# Patient Record
Sex: Female | Born: 1980 | Race: Black or African American | Hispanic: No | Marital: Single | Smoking: Current some day smoker
Health system: Southern US, Community
[De-identification: ages and names within clinical notes are randomized; demographics above are authoritative.]

## PROBLEM LIST (undated history)

## (undated) DIAGNOSIS — W3400XA Accidental discharge from unspecified firearms or gun, initial encounter: Secondary | ICD-10-CM

## (undated) DIAGNOSIS — E119 Type 2 diabetes mellitus without complications: Secondary | ICD-10-CM

## (undated) HISTORY — PX: APPENDECTOMY: SHX54

---

## 2015-03-17 DIAGNOSIS — W3400XA Accidental discharge from unspecified firearms or gun, initial encounter: Secondary | ICD-10-CM | POA: Insufficient documentation

## 2017-04-09 DIAGNOSIS — K831 Obstruction of bile duct: Secondary | ICD-10-CM | POA: Insufficient documentation

## 2017-04-10 DIAGNOSIS — Z8632 Personal history of gestational diabetes: Secondary | ICD-10-CM | POA: Insufficient documentation

## 2020-01-08 ENCOUNTER — Emergency Department (HOSPITAL_BASED_OUTPATIENT_CLINIC_OR_DEPARTMENT_OTHER)
Admission: EM | Admit: 2020-01-08 | Discharge: 2020-01-08 | Disposition: A | Discharge status: Home / Self Care | Payer: Medicaid Other | Attending: Emergency Medicine | Admitting: Emergency Medicine

## 2020-01-08 ENCOUNTER — Emergency Department (HOSPITAL_BASED_OUTPATIENT_CLINIC_OR_DEPARTMENT_OTHER): Payer: Medicaid Other

## 2020-01-08 ENCOUNTER — Other Ambulatory Visit: Payer: Self-pay

## 2020-01-08 ENCOUNTER — Encounter (HOSPITAL_BASED_OUTPATIENT_CLINIC_OR_DEPARTMENT_OTHER): Payer: Self-pay

## 2020-01-08 DIAGNOSIS — F17210 Nicotine dependence, cigarettes, uncomplicated: Secondary | ICD-10-CM | POA: Diagnosis not present

## 2020-01-08 DIAGNOSIS — R109 Unspecified abdominal pain: Secondary | ICD-10-CM | POA: Diagnosis present

## 2020-01-08 DIAGNOSIS — R7401 Elevation of levels of liver transaminase levels: Secondary | ICD-10-CM | POA: Insufficient documentation

## 2020-01-08 DIAGNOSIS — R350 Frequency of micturition: Secondary | ICD-10-CM | POA: Insufficient documentation

## 2020-01-08 DIAGNOSIS — R7989 Other specified abnormal findings of blood chemistry: Secondary | ICD-10-CM

## 2020-01-08 DIAGNOSIS — R631 Polydipsia: Secondary | ICD-10-CM | POA: Diagnosis not present

## 2020-01-08 DIAGNOSIS — R739 Hyperglycemia, unspecified: Secondary | ICD-10-CM | POA: Diagnosis not present

## 2020-01-08 DIAGNOSIS — R1013 Epigastric pain: Secondary | ICD-10-CM | POA: Diagnosis not present

## 2020-01-08 LAB — COMPREHENSIVE METABOLIC PANEL
ALT: 45 U/L — ABNORMAL HIGH (ref 0–44)
AST: 105 U/L — ABNORMAL HIGH (ref 15–41)
Albumin: 3.9 g/dL (ref 3.5–5.0)
Alkaline Phosphatase: 107 U/L (ref 38–126)
Anion gap: 10 (ref 5–15)
BUN: 10 mg/dL (ref 6–20)
CO2: 24 mmol/L (ref 22–32)
Calcium: 9.2 mg/dL (ref 8.9–10.3)
Chloride: 98 mmol/L (ref 98–111)
Creatinine, Ser: 0.75 mg/dL (ref 0.44–1.00)
GFR calc Af Amer: >60 mL/min (ref >60)
GFR calc non Af Amer: >60 mL/min (ref >60)
Glucose, Bld: 292 mg/dL — ABNORMAL HIGH (ref 70–99)
Potassium: 4.3 mmol/L (ref 3.5–5.1)
Sodium: 132 mmol/L — ABNORMAL LOW (ref 135–145)
Total Bilirubin: 1.6 mg/dL — ABNORMAL HIGH (ref 0.3–1.2)
Total Protein: 7.4 g/dL (ref 6.5–8.1)

## 2020-01-08 LAB — CBC WITH DIFFERENTIAL/PLATELET
Abs Immature Granulocytes: 0.01 10*3/uL (ref 0.00–0.07)
Basophils Absolute: 0 10*3/uL (ref 0.0–0.1)
Basophils Relative: 1 %
Eosinophils Absolute: 0.1 10*3/uL (ref 0.0–0.5)
Eosinophils Relative: 1 %
HCT: 46.5 % — ABNORMAL HIGH (ref 36.0–46.0)
Hemoglobin: 15.3 g/dL — ABNORMAL HIGH (ref 12.0–15.0)
Immature Granulocytes: 0 %
Lymphocytes Relative: 32 %
Lymphs Abs: 2.6 10*3/uL (ref 0.7–4.0)
MCH: 31.4 pg (ref 26.0–34.0)
MCHC: 32.9 g/dL (ref 30.0–36.0)
MCV: 95.5 fL (ref 80.0–100.0)
Monocytes Absolute: 0.6 10*3/uL (ref 0.1–1.0)
Monocytes Relative: 8 %
Neutro Abs: 4.8 10*3/uL (ref 1.7–7.7)
Neutrophils Relative %: 58 %
Platelets: 215 10*3/uL (ref 150–400)
RBC: 4.87 MIL/uL (ref 3.87–5.11)
RDW: 12 % (ref 11.5–15.5)
WBC: 8.2 10*3/uL (ref 4.0–10.5)
nRBC: 0 % (ref 0.0–0.2)

## 2020-01-08 LAB — URINALYSIS, ROUTINE W REFLEX MICROSCOPIC
Bilirubin Urine: NEGATIVE
Glucose, UA: >=500 mg/dL — AB
Hgb urine dipstick: NEGATIVE
Ketones, ur: NEGATIVE mg/dL
Leukocytes,Ua: NEGATIVE
Nitrite: NEGATIVE
Protein, ur: NEGATIVE mg/dL
Specific Gravity, Urine: 1.02 (ref 1.005–1.030)
pH: 7 (ref 5.0–8.0)

## 2020-01-08 LAB — URINALYSIS, MICROSCOPIC (REFLEX)

## 2020-01-08 LAB — CBG MONITORING, ED
Glucose-Capillary: 278 mg/dL — ABNORMAL HIGH (ref 70–99)
Glucose-Capillary: 224 mg/dL — ABNORMAL HIGH (ref 70–99)

## 2020-01-08 LAB — LIPASE, BLOOD: Lipase: 31 U/L (ref 11–51)

## 2020-01-08 LAB — PREGNANCY, URINE: Preg Test, Ur: NEGATIVE

## 2020-01-08 MED ORDER — ACETAMINOPHEN 325 MG PO TABS
650.0000 mg | ORAL_TABLET | Freq: Once | ORAL | Status: AC
  Administered 2020-01-08: 650 mg via ORAL
  Filled 2020-01-08: qty 2

## 2020-01-08 MED ORDER — PANTOPRAZOLE SODIUM 40 MG IV SOLR
40.0000 mg | Freq: Once | INTRAVENOUS | Status: AC
  Administered 2020-01-08: 40 mg via INTRAVENOUS
  Filled 2020-01-08: qty 40

## 2020-01-08 MED ORDER — METFORMIN HCL 500 MG PO TABS
500.0000 mg | ORAL_TABLET | Freq: Once | ORAL | Status: AC
  Administered 2020-01-08: 16:00:00 500 mg via ORAL
  Filled 2020-01-08: qty 1

## 2020-01-08 MED ORDER — SODIUM CHLORIDE 0.9 % IV BOLUS
1000.0000 mL | Freq: Once | INTRAVENOUS | Status: AC
  Administered 2020-01-08: 16:00:00 1000 mL via INTRAVENOUS

## 2020-01-08 MED ORDER — LIDOCAINE VISCOUS HCL 2 % MT SOLN
15.0000 mL | Freq: Once | OROMUCOSAL | Status: AC
  Administered 2020-01-08: 16:00:00 15 mL via ORAL
  Filled 2020-01-08: qty 15

## 2020-01-08 MED ORDER — PANTOPRAZOLE SODIUM 20 MG PO TBEC: 20.0000 mg | DELAYED_RELEASE_TABLET | Freq: Every day | ORAL | 1 refills | Status: AC

## 2020-01-08 MED ORDER — METFORMIN HCL 500 MG PO TABS: 500.0000 mg | ORAL_TABLET | Freq: Two times a day (BID) | ORAL | 2 refills | Status: AC

## 2020-01-08 MED ORDER — ALUM & MAG HYDROXIDE-SIMETH 200-200-20 MG/5ML PO SUSP
30.0000 mL | Freq: Once | ORAL | Status: AC
  Administered 2020-01-08: 30 mL via ORAL
  Filled 2020-01-08: qty 30

## 2020-01-08 NOTE — ED Triage Notes (Addendum)
Pt c/o epigastric pain and right flank pain started 2am-NAD-steady gait

## 2020-01-08 NOTE — ED Notes (Signed)
ED Provider at bedside. 

## 2020-01-08 NOTE — Discharge Instructions (Signed)
Per our discussion, I am prescribing you 2 medications.  The first medication is Protonix.  This will help reduce the acid levels in your stomach and alleviate some of your stomach pain.  Please take this once a day in the morning before breakfast.  I am also prescribing you Metformin.  Please take this twice a day for your high blood sugar.  Please follow-up with the 2 referrals that I have given you today.  One is to a gastroenterologist and another is for a Development worker, international aid.  They can discuss your symptoms further with you.  Please do not hesitate to return to the emergency department with any new or worsening symptoms.
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
## 2020-01-08 NOTE — ED Notes (Signed)
Patient transported to Ultrasound 

## 2020-01-19 ENCOUNTER — Other Ambulatory Visit: Payer: Self-pay

## 2020-01-19 ENCOUNTER — Encounter (HOSPITAL_BASED_OUTPATIENT_CLINIC_OR_DEPARTMENT_OTHER): Payer: Self-pay | Admitting: Emergency Medicine

## 2020-01-19 ENCOUNTER — Observation Stay (HOSPITAL_BASED_OUTPATIENT_CLINIC_OR_DEPARTMENT_OTHER)
Admission: EM | Admit: 2020-01-19 | Discharge: 2020-01-21 | Disposition: A | Discharge status: Home / Self Care | Payer: Medicaid Other | Attending: Physician Assistant | Admitting: Physician Assistant

## 2020-01-19 DIAGNOSIS — Z79899 Other long term (current) drug therapy: Secondary | ICD-10-CM | POA: Diagnosis not present

## 2020-01-19 DIAGNOSIS — Z880 Allergy status to penicillin: Secondary | ICD-10-CM | POA: Insufficient documentation

## 2020-01-19 DIAGNOSIS — K8010 Calculus of gallbladder with chronic cholecystitis without obstruction: Principal | ICD-10-CM | POA: Insufficient documentation

## 2020-01-19 DIAGNOSIS — R1013 Epigastric pain: Secondary | ICD-10-CM

## 2020-01-19 DIAGNOSIS — Z833 Family history of diabetes mellitus: Secondary | ICD-10-CM | POA: Diagnosis not present

## 2020-01-19 DIAGNOSIS — K8000 Calculus of gallbladder with acute cholecystitis without obstruction: Secondary | ICD-10-CM

## 2020-01-19 DIAGNOSIS — E119 Type 2 diabetes mellitus without complications: Secondary | ICD-10-CM | POA: Diagnosis not present

## 2020-01-19 DIAGNOSIS — F17210 Nicotine dependence, cigarettes, uncomplicated: Secondary | ICD-10-CM | POA: Diagnosis not present

## 2020-01-19 DIAGNOSIS — Z20822 Contact with and (suspected) exposure to covid-19: Secondary | ICD-10-CM | POA: Diagnosis not present

## 2020-01-19 DIAGNOSIS — Z419 Encounter for procedure for purposes other than remedying health state, unspecified: Secondary | ICD-10-CM

## 2020-01-19 DIAGNOSIS — Z7984 Long term (current) use of oral hypoglycemic drugs: Secondary | ICD-10-CM | POA: Insufficient documentation

## 2020-01-19 DIAGNOSIS — K8020 Calculus of gallbladder without cholecystitis without obstruction: Secondary | ICD-10-CM | POA: Diagnosis present

## 2020-01-19 HISTORY — DX: Accidental discharge from unspecified firearms or gun, initial encounter: W34.00XA

## 2020-01-19 HISTORY — DX: Type 2 diabetes mellitus without complications: E11.9

## 2020-01-19 LAB — PREGNANCY, URINE: Preg Test, Ur: NEGATIVE

## 2020-01-19 LAB — COMPREHENSIVE METABOLIC PANEL
ALT: 56 U/L — ABNORMAL HIGH (ref 0–44)
AST: 156 U/L — ABNORMAL HIGH (ref 15–41)
Albumin: 4 g/dL (ref 3.5–5.0)
Alkaline Phosphatase: 132 U/L — ABNORMAL HIGH (ref 38–126)
Anion gap: 9 (ref 5–15)
BUN: 14 mg/dL (ref 6–20)
CO2: 27 mmol/L (ref 22–32)
Calcium: 9.4 mg/dL (ref 8.9–10.3)
Chloride: 99 mmol/L (ref 98–111)
Creatinine, Ser: 0.86 mg/dL (ref 0.44–1.00)
GFR calc Af Amer: >60 mL/min (ref >60)
GFR calc non Af Amer: >60 mL/min (ref >60)
Glucose, Bld: 260 mg/dL — ABNORMAL HIGH (ref 70–99)
Potassium: 4 mmol/L (ref 3.5–5.1)
Sodium: 135 mmol/L (ref 135–145)
Total Bilirubin: 1 mg/dL (ref 0.3–1.2)
Total Protein: 7.5 g/dL (ref 6.5–8.1)

## 2020-01-19 LAB — CBC WITH DIFFERENTIAL/PLATELET
Abs Immature Granulocytes: 0.03 10*3/uL (ref 0.00–0.07)
Basophils Absolute: 0 10*3/uL (ref 0.0–0.1)
Basophils Relative: 0 %
Eosinophils Absolute: 0.1 10*3/uL (ref 0.0–0.5)
Eosinophils Relative: 1 %
HCT: 44.7 % (ref 36.0–46.0)
Hemoglobin: 15.1 g/dL — ABNORMAL HIGH (ref 12.0–15.0)
Immature Granulocytes: 0 %
Lymphocytes Relative: 33 %
Lymphs Abs: 3 10*3/uL (ref 0.7–4.0)
MCH: 31.5 pg (ref 26.0–34.0)
MCHC: 33.8 g/dL (ref 30.0–36.0)
MCV: 93.3 fL (ref 80.0–100.0)
Monocytes Absolute: 0.4 10*3/uL (ref 0.1–1.0)
Monocytes Relative: 5 %
Neutro Abs: 5.6 10*3/uL (ref 1.7–7.7)
Neutrophils Relative %: 61 %
Platelets: 244 10*3/uL (ref 150–400)
RBC: 4.79 MIL/uL (ref 3.87–5.11)
RDW: 11.9 % (ref 11.5–15.5)
WBC: 9.1 10*3/uL (ref 4.0–10.5)
nRBC: 0 % (ref 0.0–0.2)

## 2020-01-19 LAB — URINALYSIS, ROUTINE W REFLEX MICROSCOPIC
Bilirubin Urine: NEGATIVE
Glucose, UA: >=500 mg/dL — AB
Ketones, ur: NEGATIVE mg/dL
Leukocytes,Ua: NEGATIVE
Nitrite: NEGATIVE
Protein, ur: NEGATIVE mg/dL
Specific Gravity, Urine: >1.03 — ABNORMAL HIGH (ref 1.005–1.030)
pH: 5.5 (ref 5.0–8.0)

## 2020-01-19 LAB — URINALYSIS, MICROSCOPIC (REFLEX)

## 2020-01-19 LAB — LIPASE, BLOOD: Lipase: 43 U/L (ref 11–51)

## 2020-01-19 MED ORDER — HYDROMORPHONE HCL 1 MG/ML IJ SOLN
0.5000 mg | INTRAMUSCULAR | Status: AC | PRN
  Administered 2020-01-20: 0.5 mg via INTRAVENOUS
  Filled 2020-01-19: qty 1

## 2020-01-19 MED ORDER — ALUM & MAG HYDROXIDE-SIMETH 200-200-20 MG/5ML PO SUSP
30.0000 mL | Freq: Once | ORAL | Status: AC
  Administered 2020-01-19: 30 mL via ORAL
  Filled 2020-01-19: qty 30

## 2020-01-19 MED ORDER — LACTATED RINGERS IV SOLN: INTRAVENOUS | Status: AC

## 2020-01-19 MED ORDER — ONDANSETRON HCL 4 MG/2ML IJ SOLN
4.0000 mg | Freq: Three times a day (TID) | INTRAMUSCULAR | Status: AC | PRN
  Administered 2020-01-20: 4 mg via INTRAVENOUS
  Filled 2020-01-19: qty 2

## 2020-01-19 MED ORDER — LIDOCAINE VISCOUS HCL 2 % MT SOLN
15.0000 mL | Freq: Once | OROMUCOSAL | Status: AC
  Administered 2020-01-19: 15 mL via ORAL
  Filled 2020-01-19: qty 15
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
## 2020-01-19 NOTE — ED Triage Notes (Signed)
Pt states she is having epigastric pain  Pt states she had the same thing about 2 weeks ago and was seen here  Pt states she ate salad today  Pt states she had taken some pills we gave her and that did not help so she took 2 extra strength tylenol

## 2020-01-20 ENCOUNTER — Observation Stay (HOSPITAL_COMMUNITY): Payer: Medicaid Other | Admitting: Anesthesiology

## 2020-01-20 ENCOUNTER — Encounter (HOSPITAL_COMMUNITY)
Admission: EM | Disposition: A | Discharge status: Home / Self Care | Payer: Self-pay | Source: Home / Self Care | Attending: Emergency Medicine

## 2020-01-20 ENCOUNTER — Observation Stay (HOSPITAL_COMMUNITY): Payer: Medicaid Other

## 2020-01-20 ENCOUNTER — Encounter (HOSPITAL_COMMUNITY): Payer: Self-pay

## 2020-01-20 DIAGNOSIS — Z7984 Long term (current) use of oral hypoglycemic drugs: Secondary | ICD-10-CM | POA: Diagnosis not present

## 2020-01-20 DIAGNOSIS — Z79899 Other long term (current) drug therapy: Secondary | ICD-10-CM | POA: Diagnosis not present

## 2020-01-20 DIAGNOSIS — Z880 Allergy status to penicillin: Secondary | ICD-10-CM | POA: Diagnosis not present

## 2020-01-20 DIAGNOSIS — E119 Type 2 diabetes mellitus without complications: Secondary | ICD-10-CM | POA: Diagnosis not present

## 2020-01-20 DIAGNOSIS — R1013 Epigastric pain: Secondary | ICD-10-CM | POA: Diagnosis present

## 2020-01-20 DIAGNOSIS — F17210 Nicotine dependence, cigarettes, uncomplicated: Secondary | ICD-10-CM | POA: Diagnosis not present

## 2020-01-20 DIAGNOSIS — Z833 Family history of diabetes mellitus: Secondary | ICD-10-CM | POA: Diagnosis not present

## 2020-01-20 DIAGNOSIS — Z20822 Contact with and (suspected) exposure to covid-19: Secondary | ICD-10-CM | POA: Diagnosis not present

## 2020-01-20 DIAGNOSIS — K8010 Calculus of gallbladder with chronic cholecystitis without obstruction: Secondary | ICD-10-CM | POA: Diagnosis not present

## 2020-01-20 HISTORY — PX: CHOLECYSTECTOMY: SHX55

## 2020-01-20 LAB — CBC
HCT: 42.7 % (ref 36.0–46.0)
Hemoglobin: 13.7 g/dL (ref 12.0–15.0)
MCH: 31.1 pg (ref 26.0–34.0)
MCHC: 32.1 g/dL (ref 30.0–36.0)
MCV: 96.8 fL (ref 80.0–100.0)
Platelets: 159 10*3/uL (ref 150–400)
RBC: 4.41 MIL/uL (ref 3.87–5.11)
RDW: 12 % (ref 11.5–15.5)
WBC: 6.7 10*3/uL (ref 4.0–10.5)
nRBC: 0 % (ref 0.0–0.2)

## 2020-01-20 LAB — HIV ANTIBODY (ROUTINE TESTING W REFLEX): HIV Screen 4th Generation wRfx: NONREACTIVE

## 2020-01-20 LAB — COMPREHENSIVE METABOLIC PANEL
ALT: 127 U/L — ABNORMAL HIGH (ref 0–44)
AST: 204 U/L — ABNORMAL HIGH (ref 15–41)
Albumin: 3.3 g/dL — ABNORMAL LOW (ref 3.5–5.0)
Alkaline Phosphatase: 124 U/L (ref 38–126)
Anion gap: 9 (ref 5–15)
BUN: 12 mg/dL (ref 6–20)
CO2: 23 mmol/L (ref 22–32)
Calcium: 8.6 mg/dL — ABNORMAL LOW (ref 8.9–10.3)
Chloride: 101 mmol/L (ref 98–111)
Creatinine, Ser: 0.75 mg/dL (ref 0.44–1.00)
GFR calc Af Amer: >60 mL/min (ref >60)
GFR calc non Af Amer: >60 mL/min (ref >60)
Glucose, Bld: 302 mg/dL — ABNORMAL HIGH (ref 70–99)
Potassium: 4 mmol/L (ref 3.5–5.1)
Sodium: 133 mmol/L — ABNORMAL LOW (ref 135–145)
Total Bilirubin: 0.9 mg/dL (ref 0.3–1.2)
Total Protein: 6.3 g/dL — ABNORMAL LOW (ref 6.5–8.1)

## 2020-01-20 LAB — GLUCOSE, CAPILLARY
Glucose-Capillary: 248 mg/dL — ABNORMAL HIGH (ref 70–99)
Glucose-Capillary: 264 mg/dL — ABNORMAL HIGH (ref 70–99)
Glucose-Capillary: 275 mg/dL — ABNORMAL HIGH (ref 70–99)
Glucose-Capillary: 316 mg/dL — ABNORMAL HIGH (ref 70–99)
Glucose-Capillary: 321 mg/dL — ABNORMAL HIGH (ref 70–99)

## 2020-01-20 LAB — HEMOGLOBIN A1C
Hgb A1c MFr Bld: 12 % — ABNORMAL HIGH (ref 4.8–5.6)
Mean Plasma Glucose: 297.7 mg/dL

## 2020-01-20 LAB — RESPIRATORY PANEL BY RT PCR (FLU A&B, COVID)
Influenza A by PCR: NEGATIVE
Influenza B by PCR: NEGATIVE
SARS Coronavirus 2 by RT PCR: NEGATIVE

## 2020-01-20 LAB — SURGICAL PCR SCREEN
MRSA, PCR: NEGATIVE
Staphylococcus aureus: NEGATIVE

## 2020-01-20 SURGERY — LAPAROSCOPIC CHOLECYSTECTOMY WITH INTRAOPERATIVE CHOLANGIOGRAM
Anesthesia: General

## 2020-01-20 MED ORDER — CIPROFLOXACIN IN D5W 400 MG/200ML IV SOLN
400.0000 mg | Freq: Two times a day (BID) | INTRAVENOUS | Status: DC
  Administered 2020-01-20 – 2020-01-21 (×4): 400 mg via INTRAVENOUS
  Filled 2020-01-20 (×4): qty 200

## 2020-01-20 MED ORDER — FENTANYL CITRATE (PF) 100 MCG/2ML IJ SOLN
INTRAMUSCULAR | Status: DC | PRN
  Administered 2020-01-20: 150 ug via INTRAVENOUS
  Administered 2020-01-20 (×2): 50 ug via INTRAVENOUS

## 2020-01-20 MED ORDER — ACETAMINOPHEN 325 MG PO TABS: 650.0000 mg | ORAL_TABLET | Freq: Four times a day (QID) | ORAL | Status: DC | PRN

## 2020-01-20 MED ORDER — ROCURONIUM BROMIDE 10 MG/ML (PF) SYRINGE
PREFILLED_SYRINGE | INTRAVENOUS | Status: DC | PRN
  Administered 2020-01-20: 60 mg via INTRAVENOUS

## 2020-01-20 MED ORDER — METOPROLOL TARTRATE 5 MG/5ML IV SOLN: 5.0000 mg | Freq: Four times a day (QID) | INTRAVENOUS | Status: DC | PRN

## 2020-01-20 MED ORDER — BUPIVACAINE HCL (PF) 0.25 % IJ SOLN
INTRAMUSCULAR | Status: AC
  Filled 2020-01-20: qty 30

## 2020-01-20 MED ORDER — PROPOFOL 10 MG/ML IV BOLUS
INTRAVENOUS | Status: AC
  Filled 2020-01-20: qty 20

## 2020-01-20 MED ORDER — 0.9 % SODIUM CHLORIDE (POUR BTL) OPTIME
TOPICAL | Status: DC | PRN
  Administered 2020-01-20: 1000 mL

## 2020-01-20 MED ORDER — IOPAMIDOL (ISOVUE-300) INJECTION 61%
INTRAVENOUS | Status: DC | PRN
  Administered 2020-01-20: 12 mL

## 2020-01-20 MED ORDER — ONDANSETRON HCL 4 MG/2ML IJ SOLN
INTRAMUSCULAR | Status: DC | PRN
  Administered 2020-01-20: 4 mg via INTRAVENOUS

## 2020-01-20 MED ORDER — KCL IN DEXTROSE-NACL 20-5-0.45 MEQ/L-%-% IV SOLN
INTRAVENOUS | Status: DC
  Filled 2020-01-20 (×2): qty 1000

## 2020-01-20 MED ORDER — OXYCODONE-ACETAMINOPHEN 5-325 MG PO TABS
1.0000 | ORAL_TABLET | Freq: Four times a day (QID) | ORAL | Status: DC | PRN
  Administered 2020-01-20: 1 via ORAL
  Filled 2020-01-20: qty 1

## 2020-01-20 MED ORDER — OXYCODONE HCL 5 MG PO TABS: 5.0000 mg | ORAL_TABLET | Freq: Once | ORAL | Status: DC | PRN

## 2020-01-20 MED ORDER — ONDANSETRON 4 MG PO TBDP: 4.0000 mg | ORAL_TABLET | Freq: Four times a day (QID) | ORAL | Status: DC | PRN

## 2020-01-20 MED ORDER — BUPIVACAINE HCL (PF) 0.25 % IJ SOLN
INTRAMUSCULAR | Status: DC | PRN
  Administered 2020-01-20: 6 mL

## 2020-01-20 MED ORDER — FENTANYL CITRATE (PF) 250 MCG/5ML IJ SOLN
INTRAMUSCULAR | Status: AC
  Filled 2020-01-20: qty 5

## 2020-01-20 MED ORDER — MORPHINE SULFATE (PF) 2 MG/ML IV SOLN
2.0000 mg | INTRAVENOUS | Status: DC | PRN
  Administered 2020-01-20 – 2020-01-21 (×5): 2 mg via INTRAVENOUS
  Filled 2020-01-20 (×6): qty 1

## 2020-01-20 MED ORDER — ONDANSETRON HCL 4 MG/2ML IJ SOLN: 4.0000 mg | Freq: Four times a day (QID) | INTRAMUSCULAR | Status: DC | PRN

## 2020-01-20 MED ORDER — POLYETHYLENE GLYCOL 3350 17 G PO PACK: 17.0000 g | PACK | Freq: Every day | ORAL | Status: DC | PRN

## 2020-01-20 MED ORDER — INSULIN ASPART 100 UNIT/ML ~~LOC~~ SOLN
0.0000 [IU] | SUBCUTANEOUS | Status: DC
  Administered 2020-01-20: 11 [IU] via SUBCUTANEOUS
  Administered 2020-01-20: 8 [IU] via SUBCUTANEOUS
  Administered 2020-01-20: 11 [IU] via SUBCUTANEOUS
  Administered 2020-01-20: 8 [IU] via SUBCUTANEOUS
  Administered 2020-01-21: 3 [IU] via SUBCUTANEOUS
  Administered 2020-01-21: 5 [IU] via SUBCUTANEOUS

## 2020-01-20 MED ORDER — LACTATED RINGERS IV SOLN: INTRAVENOUS | Status: DC | PRN

## 2020-01-20 MED ORDER — MIDAZOLAM HCL 2 MG/2ML IJ SOLN
INTRAMUSCULAR | Status: AC
  Filled 2020-01-20: qty 2

## 2020-01-20 MED ORDER — FENTANYL CITRATE (PF) 100 MCG/2ML IJ SOLN
INTRAMUSCULAR | Status: AC
  Administered 2020-01-20: 14:00:00 50 ug via INTRAVENOUS
  Filled 2020-01-20: qty 2

## 2020-01-20 MED ORDER — ACETAMINOPHEN 650 MG RE SUPP: 650.0000 mg | Freq: Four times a day (QID) | RECTAL | Status: DC | PRN

## 2020-01-20 MED ORDER — ONDANSETRON HCL 4 MG/2ML IJ SOLN
INTRAMUSCULAR | Status: AC
  Filled 2020-01-20: qty 2

## 2020-01-20 MED ORDER — PHENYLEPHRINE 40 MCG/ML (10ML) SYRINGE FOR IV PUSH (FOR BLOOD PRESSURE SUPPORT)
PREFILLED_SYRINGE | INTRAVENOUS | Status: DC | PRN
  Administered 2020-01-20: 40 ug via INTRAVENOUS
  Administered 2020-01-20: 80 ug via INTRAVENOUS
  Administered 2020-01-20: 120 ug via INTRAVENOUS
  Administered 2020-01-20: 160 ug via INTRAVENOUS

## 2020-01-20 MED ORDER — ACETAMINOPHEN 500 MG PO TABS
1000.0000 mg | ORAL_TABLET | ORAL | Status: AC
  Administered 2020-01-20: 1000 mg via ORAL
  Filled 2020-01-20: qty 2

## 2020-01-20 MED ORDER — LIDOCAINE 2% (20 MG/ML) 5 ML SYRINGE
INTRAMUSCULAR | Status: DC | PRN
  Administered 2020-01-20: 80 mg via INTRAVENOUS

## 2020-01-20 MED ORDER — GABAPENTIN 300 MG PO CAPS
300.0000 mg | ORAL_CAPSULE | ORAL | Status: AC
  Administered 2020-01-20: 300 mg via ORAL
  Filled 2020-01-20: qty 1

## 2020-01-20 MED ORDER — OXYCODONE HCL 5 MG PO TABS: 5.0000 mg | ORAL_TABLET | Freq: Four times a day (QID) | ORAL | Status: DC | PRN

## 2020-01-20 MED ORDER — DEXAMETHASONE SODIUM PHOSPHATE 10 MG/ML IJ SOLN
INTRAMUSCULAR | Status: DC | PRN
  Administered 2020-01-20: 5 mg via INTRAVENOUS

## 2020-01-20 MED ORDER — ENSURE PRE-SURGERY PO LIQD: 296.0000 mL | Freq: Once | ORAL | Status: DC

## 2020-01-20 MED ORDER — OXYCODONE HCL 5 MG/5ML PO SOLN: 5.0000 mg | Freq: Once | ORAL | Status: DC | PRN

## 2020-01-20 MED ORDER — PROPOFOL 10 MG/ML IV BOLUS
INTRAVENOUS | Status: DC | PRN
  Administered 2020-01-20: 200 mg via INTRAVENOUS

## 2020-01-20 MED ORDER — LIVING WELL WITH DIABETES BOOK
Freq: Once | Status: AC
  Filled 2020-01-20: qty 1

## 2020-01-20 MED ORDER — FENTANYL CITRATE (PF) 100 MCG/2ML IJ SOLN
25.0000 ug | INTRAMUSCULAR | Status: DC | PRN
  Administered 2020-01-20: 50 ug via INTRAVENOUS

## 2020-01-20 MED ORDER — LACTATED RINGERS IV SOLN
INTRAVENOUS | Status: DC | PRN
  Administered 2020-01-20: 1000 mL via INTRAVENOUS

## 2020-01-20 MED ORDER — PROMETHAZINE HCL 25 MG/ML IJ SOLN: 6.2500 mg | INTRAMUSCULAR | Status: DC | PRN

## 2020-01-20 MED ORDER — MIDAZOLAM HCL 5 MG/5ML IJ SOLN
INTRAMUSCULAR | Status: DC | PRN
  Administered 2020-01-20: 2 mg via INTRAVENOUS

## 2020-01-20 SURGICAL SUPPLY — 35 items
APPLIER CLIP ROT 10 11.4 M/L (STAPLE) ×3
CHLORAPREP W/TINT 26 (MISCELLANEOUS) ×3 IMPLANT
CLIP APPLIE ROT 10 11.4 M/L (STAPLE) ×1 IMPLANT
COVER MAYO STAND STRL (DRAPES) ×3 IMPLANT
COVER WAND RF STERILE (DRAPES) IMPLANT
DECANTER SPIKE VIAL GLASS SM (MISCELLANEOUS) ×3 IMPLANT
DERMABOND ADVANCED (GAUZE/BANDAGES/DRESSINGS) ×2
DERMABOND ADVANCED .7 DNX12 (GAUZE/BANDAGES/DRESSINGS) ×1 IMPLANT
DRAPE C-ARM 42X120 X-RAY (DRAPES) ×3 IMPLANT
DRAPE UTILITY XL STRL (DRAPES) ×3 IMPLANT
DRAPE WARM FLUID 44X44 (DRAPES) ×3 IMPLANT
ELECT REM PT RETURN 15FT ADLT (MISCELLANEOUS) ×3 IMPLANT
GLOVE INDICATOR 8.0 STRL GRN (GLOVE) ×3 IMPLANT
GLOVE SS BIOGEL STRL SZ 7.5 (GLOVE) ×1 IMPLANT
GLOVE SUPERSENSE BIOGEL SZ 7.5 (GLOVE) ×2
GOWN STRL REUS W/TWL XL LVL3 (GOWN DISPOSABLE) ×6 IMPLANT
HEMOSTAT SURGICEL 4X8 (HEMOSTASIS) IMPLANT
KIT BASIN (CUSTOM PROCEDURE TRAY) ×3 IMPLANT
KIT TURNOVER KIT A (KITS) IMPLANT
PENCIL SMOKE EVACUATOR (MISCELLANEOUS) IMPLANT
POUCH SPECIMEN RETRIEVAL 10MM (ENDOMECHANICALS) ×3 IMPLANT
PROTECTOR NERVE ULNAR (MISCELLANEOUS) IMPLANT
SCISSORS LAP 5X35 DISP (ENDOMECHANICALS) IMPLANT
SET CHOLANGIOGRAPH MIX (MISCELLANEOUS) ×3 IMPLANT
SET IRRIG TUBING LAPAROSCOPIC (IRRIGATION / IRRIGATOR) ×3 IMPLANT
SET TUBE SMOKE EVAC HIGH FLOW (TUBING) IMPLANT
SLEEVE XCEL OPT CAN 5 100 (ENDOMECHANICALS) ×3 IMPLANT
SUT MNCRL AB 4-0 PS2 18 (SUTURE) ×3 IMPLANT
TAPE CLOTH 4X10 WHT NS (GAUZE/BANDAGES/DRESSINGS) IMPLANT
TOWEL OR 17X26 10 PK STRL BLUE (TOWEL DISPOSABLE) ×3 IMPLANT
TOWEL OR NON WOVEN STRL DISP B (DISPOSABLE) ×3 IMPLANT
TRAY LAPAROSCOPIC (CUSTOM PROCEDURE TRAY) ×3 IMPLANT
TROCAR BLADELESS OPT 5 100 (ENDOMECHANICALS) ×3 IMPLANT
TROCAR XCEL BLUNT TIP 100MML (ENDOMECHANICALS) ×3 IMPLANT
TROCAR XCEL NON-BLD 11X100MML (ENDOMECHANICALS) ×3 IMPLANT
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
## 2020-01-20 NOTE — ED Notes (Signed)
[facility] notified (Allison Ibarra) - patient ready for transport 

## 2020-01-20 NOTE — Anesthesia Procedure Notes (Signed)
Procedure Name: Intubation Performed by: Gean Maidens, CRNA Pre-anesthesia Checklist: Patient identified, Emergency Drugs available, Suction available, Patient being monitored and Timeout performed Patient Re-evaluated:Patient Re-evaluated prior to induction Oxygen Delivery Method: Circle system utilized Preoxygenation: Pre-oxygenation with 100% oxygen Induction Type: IV induction Ventilation: Mask ventilation without difficulty Laryngoscope Size: Mac and 3 Grade View: Grade I Tube type: Oral Tube size: 7.0 mm Number of attempts: 1 Airway Equipment and Method: Stylet Placement Confirmation: ETT inserted through vocal cords under direct vision and breath sounds checked- equal and bilateral Secured at: 21 cm Tube secured with: Tape Dental Injury: Teeth and Oropharynx as per pre-operative assessment

## 2020-01-20 NOTE — Op Note (Signed)
Laparoscopic Cholecystectomy with IOC Procedure Note  Indications: This patient presents with symptomatic gallbladder disease and will undergo laparoscopic cholecystectomy. The procedure has been discussed with the patient. Operative and non operative treatments have been discussed. Risks of surgery include bleeding, infection,  Common bile duct injury,  Injury to the stomach,liver, colon,small intestine, abdominal wall,  Diaphragm,  Major blood vessels,  And the need for an open procedure.  Other risks include worsening of medical problems, death,  DVT and pulmonary embolism, and cardiovascular events.   Medical options have also been discussed. The patient has been informed of long term expectations of surgery and non surgical options,  The patient agrees to proceed.    Pre-operative Diagnosis: Calculus of gallbladder with acute cholecystitis, without mention of obstruction  Post-operative Diagnosis: Same  Surgeon: Dortha Schwalbe MD   Assistants: OR  STAFF  Anesthesia: General endotracheal anesthesia and Local anesthesia 0.25.% bupivacaine  ASA Class: 1  Procedure Details  The patient was seen again in the Holding Room. The risks, benefits, complications, treatment options, and expected outcomes were discussed with the patient. The possibilities of reaction to medication, pulmonary aspiration, perforation of viscus, bleeding, recurrent infection, finding a normal gallbladder, the need for additional procedures, failure to diagnose a condition, the possible need to convert to an open procedure, and creating a complication requiring transfusion or operation were discussed with the patient. The patient and/or family concurred with the proposed plan, giving informed consent. The site of surgery properly noted/marked. The patient was taken to Operating Room, identified as Allison Ibarra and the procedure verified as Laparoscopic Cholecystectomy with Intraoperative Cholangiograms. A Time Out was held and  the above information confirmed.  Prior to the induction of general anesthesia, antibiotic prophylaxis was administered. General endotracheal anesthesia was then administered and tolerated well. After the induction, the abdomen was prepped in the usual sterile fashion. The patient was positioned in the supine position with the left arm comfortably tucked, along with some reverse Trendelenburg.  Local anesthetic agent was injected into the skin near the umbilicus and an incision made. The midline fascia was incised and the Hasson technique was used to introduce a 12 mm port under direct vision. It was secured with a figure of eight Vicryl suture placed in the usual fashion. Pneumoperitoneum was then created with CO2 and tolerated well without any adverse changes in the patient's vital signs. Additional trocars were introduced under direct vision with an 11 mm trocar in the epigastrium and 2 5 mm trocars in the right upper quadrant. All skin incisions were infiltrated with a local anesthetic agent before making the incision and placing the trocars.   The gallbladder was identified, the fundus grasped and retracted cephalad. Adhesions were lysed bluntly and with the electrocautery where indicated, taking care not to injure any adjacent organs or viscus. The infundibulum was grasped and retracted laterally, exposing the peritoneum overlying the triangle of Calot. This was then divided and exposed in a blunt fashion. The cystic duct was clearly identified and bluntly dissected circumferentially. The junctions of the gallbladder, cystic duct and common bile duct were clearly identified prior to the division of any linear structure.   An incision was made in the cystic duct and the cholangiogram catheter introduced. The catheter was secured using an endoclip. The study showed no stones and good visualization of the distal and proximal biliary tree. The catheter was then removed.   The cystic duct was then   ligated with surgical clips  on the patient  side and  clipped on the gallbladder side and divided. The cystic artery was identified, dissected free, ligated with clips and divided as well. Posterior cystic artery clipped and divided.  The gallbladder was dissected from the liver bed in retrograde fashion with the electrocautery. The gallbladder was removed. The liver bed was irrigated and inspected. Hemostasis was achieved with the electrocautery. Copious irrigation was utilized and was repeatedly aspirated until clear all particulate matter. Hemostasis was achieved with no signs  Of bleeding or bile leakage.  Pneumoperitoneum was completely reduced after viewing removal of the trocars under direct vision. The wound was thoroughly irrigated and the fascia was then closed with a figure of eight suture; the skin was then closed with 4 O MONOCRYL  and a sterile dressing was applied.  Instrument, sponge, and needle counts were correct at closure and at the conclusion of the case.   Findings: Cholecystitis with Cholelithiasis  Estimated Blood Loss: less than 50 mL         Drains: NONE          Total IV Fluids: per record          Specimens: Gallbladder           Complications: None; patient tolerated the procedure well.         Disposition: PACU - hemodynamically stable.         Condition: stable

## 2020-01-20 NOTE — Anesthesia Preprocedure Evaluation (Addendum)
Anesthesia Evaluation  Patient identified by MRN, date of birth, ID band Patient awake    Reviewed: Allergy & Precautions, NPO status , Patient's Chart, lab work & pertinent test results  History of Anesthesia Complications Negative for: history of anesthetic complications  Airway Mallampati: II  TM Distance: >3 FB Neck ROM: Full    Dental  (+) Dental Advisory Given, Chipped,    Pulmonary Current Smoker and Patient abstained from smoking.,    Pulmonary exam normal        Cardiovascular negative cardio ROS Normal cardiovascular exam     Neuro/Psych negative neurological ROS  negative psych ROS   GI/Hepatic negative GI ROS, Neg liver ROS,   Endo/Other  diabetes, Type 2, Oral Hypoglycemic Agents Obesity   Renal/GU negative Renal ROS     Musculoskeletal negative musculoskeletal ROS (+)   Abdominal   Peds  Hematology negative hematology ROS (+)   Anesthesia Other Findings Covid neg 5/4   Reproductive/Obstetrics                            Anesthesia Physical Anesthesia Plan  ASA: II  Anesthesia Plan: General   Post-op Pain Management:    Induction: Intravenous  PONV Risk Score and Plan: 4 or greater and Treatment may vary due to age or medical condition, Ondansetron, Dexamethasone and Midazolam  Airway Management Planned: Oral ETT  Additional Equipment: None  Intra-op Plan:   Post-operative Plan: Extubation in OR  Informed Consent: I have reviewed the patients History and Physical, chart, labs and discussed the procedure including the risks, benefits and alternatives for the proposed anesthesia with the patient or authorized representative who has indicated his/her understanding and acceptance.     Dental advisory given  Plan Discussed with: CRNA and Anesthesiologist  Anesthesia Plan Comments:        Anesthesia Quick Evaluation

## 2020-01-20 NOTE — Anesthesia Postprocedure Evaluation (Signed)
Anesthesia Post Note  Patient: Allison Ibarra  Procedure(s) Performed: LAPAROSCOPIC CHOLECYSTECTOMY WITH POSSIBLE INTRAOPERATIVE CHOLANGIOGRAM (N/A )     Patient location during evaluation: PACU Anesthesia Type: General Level of consciousness: awake and alert Pain management: pain level controlled Vital Signs Assessment: post-procedure vital signs reviewed and stable Respiratory status: spontaneous breathing, nonlabored ventilation and respiratory function stable Cardiovascular status: blood pressure returned to baseline and stable Postop Assessment: no apparent nausea or vomiting Anesthetic complications: no    Last Vitals:  Vitals:   01/20/20 1430 01/20/20 1446  BP: 121/74 131/83  Pulse: 82 83  Resp: 14 18  Temp: 37 C 36.9 C  SpO2: 97% 95%                  Beryle Lathe

## 2020-01-20 NOTE — Progress Notes (Signed)
Inpatient Diabetes Program Recommendations  AACE/ADA: New Consensus Statement on Inpatient Glycemic Control (2015)  Target Ranges:  Prepandial:   less than 140 mg/dL      Peak postprandial:   less than 180 mg/dL (1-2 hours)      Critically ill patients:  140 - 180 mg/dL   Lab Results  Component Value Date   GLUCAP 248 (H) 01/20/2020   HGBA1C 12.0 (H) 01/20/2020    Review of Glycemic Control  Diabetes history: DM2 Outpatient Diabetes medications: None, previously on metformin, stopped due to GI issues Current orders for Inpatient glycemic control: Novolog 0-15 units Q4H   HgbA1C - 12% - uncontrolled DM.  Briefly spoke with pt about her diabetes and HgbA1C. Pt states she stopped taking metformin a few weeks ago d/t diarrhea. Does not have a PCP. No insurance. Discussed importance of controlling blood sugars to reduce risk of complications. Pt does not monitor blood sugars at home.   Blood sugars 302, 316, 248 thus far today. Received Decadron 5 mg in surgery.  Inpatient Diabetes Program Recommendations:     Ordered TOC consult for assistance with PCP to manage her diabetes Needs prescription for blood glucose meter at discharge.  Will follow-up in the am.   Thank you. Ailene Ards, RD, LDN, CDE Inpatient Diabetes Coordinator [phone]

## 2020-01-20 NOTE — Progress Notes (Signed)
MD Kinsinger was paged when patient arrived to unit per order. She is complaining of abdominal pain 9 out of 10, PRN IV Dilaudid 0.5mg  every 4 hours was given at 0005.

## 2020-01-20 NOTE — Discharge Instructions (Signed)
[facility] ° °Please arrive at least 30 min before your appointment to complete your check in paperwork.  If you are unable to arrive 30 min prior to your appointment time we may have to cancel or reschedule you. °LAPAROSCOPIC SURGERY: POST OP INSTRUCTIONS °Always review your discharge instruction sheet given to you by the facility where your surgery was performed. °IF YOU HAVE DISABILITY OR FAMILY LEAVE FORMS, YOU MUST BRING THEM TO THE OFFICE FOR PROCESSING.   °DO NOT GIVE THEM TO YOUR DOCTOR. ° °PAIN CONTROL ° °1. First take acetaminophen (Tylenol) AND/or ibuprofen (Advil) to control your pain after surgery.  Follow directions on package.  Taking acetaminophen (Tylenol) and/or ibuprofen (Advil) regularly after surgery will help to control your pain and lower the amount of prescription pain medication you may need.  You should not take more than 4,000 mg (4 grams) of acetaminophen (Tylenol) in 24 hours.  You should not take ibuprofen (Advil), aleve, motrin, naprosyn or other NSAIDS if you have a history of stomach ulcers or chronic kidney disease.  °2. A prescription for pain medication may be given to you upon discharge.  Take your pain medication as prescribed, if you still have uncontrolled pain after taking acetaminophen (Tylenol) or ibuprofen (Advil). °3. Use ice packs to help control pain. °4. If you need a refill on your pain medication, please contact your pharmacy.  They will contact our office to request authorization. Prescriptions will not be filled after 5pm or on week-ends. ° °HOME MEDICATIONS °5. Take your usually prescribed medications unless otherwise directed. ° °DIET °6. You should follow a light diet the first few days after arrival home.  Be sure to include lots of fluids daily. Avoid fatty, fried foods.  ° °CONSTIPATION °7. It is common to experience some constipation after surgery and if you are taking pain medication.  Increasing fluid intake and taking a stool  softener (such as Colace) will usually help or prevent this problem from occurring.  A mild laxative (Milk of Magnesia or Miralax) should be taken according to package instructions if there are no bowel movements after 48 hours. ° °WOUND/INCISION CARE °8. Most patients will experience some swelling and bruising in the area of the incisions.  Ice packs will help.  Swelling and bruising can take several days to resolve.  °9. Unless discharge instructions indicate otherwise, follow guidelines below  °a. STERI-STRIPS - you may remove your outer bandages 48 hours after surgery, and you may shower at that time.  You have steri-strips (small skin tapes) in place directly over the incision.  These strips should be left on the skin for 7-10 days.   °b. DERMABOND/SKIN GLUE - you may shower in 24 hours.  The glue will flake off over the next 2-3 weeks. °10. Any sutures or staples will be removed at the office during your follow-up visit. ° °ACTIVITIES °11. You may resume regular (light) daily activities beginning the next day--such as daily self-care, walking, climbing stairs--gradually increasing activities as tolerated.  You may have sexual intercourse when it is comfortable.  Refrain from any heavy lifting or straining until approved by your doctor. °a. You may drive when you are no longer taking prescription pain medication, you can comfortably wear a seatbelt, and you can safely maneuver your car and apply brakes. ° °FOLLOW-UP °12. You should see your doctor in the office for a follow-up appointment approximately 2-3 weeks after your surgery.  You should have been given your post-op/follow-up appointment when   your surgery was scheduled.  If you did not receive a post-op/follow-up appointment, make sure that you call for this appointment within a day or two after you arrive home to insure a convenient appointment time. ° °OTHER INSTRUCTIONS ° °WHEN TO CALL YOUR DOCTOR: °1. Fever over 101.0 °2. Inability to  urinate °3. Continued bleeding from incision. °4. Increased pain, redness, or drainage from the incision. °5. Increasing abdominal pain ° °The clinic staff is available to answer your questions during regular business hours.  Please don’t hesitate to call and ask to speak to one of the nurses for clinical concerns.  If you have a medical emergency, go to the nearest emergency room or call 911.  A surgeon from [facility] is always on call at the hospital. °[address], [address], [place], [place]  [address] ? [address] [address], , [place]   [address] °[phone] ? [phone] ? [phone] [phone] ° ° ° °

## 2020-01-20 NOTE — Transfer of Care (Signed)
Immediate Anesthesia Transfer of Care Note  Patient: Allison Ibarra  Procedure(s) Performed: LAPAROSCOPIC CHOLECYSTECTOMY WITH POSSIBLE INTRAOPERATIVE CHOLANGIOGRAM (N/A )  Patient Location: PACU  Anesthesia Type:General  Level of Consciousness: awake, alert  and oriented  Airway & Oxygen Therapy: Patient Spontanous Breathing and Patient connected to face mask oxygen  Post-op Assessment: Report given to RN and Post -op Vital signs reviewed and stable  Post vital signs: Reviewed and stable  Last Vitals:  Vitals Value Taken Time  BP 151/86 01/20/20 1339  Temp    Pulse 90 01/20/20 1343  Resp 13 01/20/20 1340  SpO2 98 % 01/20/20 1343  Vitals shown include unvalidated device data.  Last Pain:  Vitals:   01/20/20 1122  TempSrc:   PainSc: 3       Patients Stated Pain Goal: 2 (01/20/20 1122)  Complications: No apparent anesthesia complications

## 2020-01-20 NOTE — TOC Initial Note (Signed)
Transition of Care [facility]) - Initial/Assessment Note    Patient Details  Name: Allison Ibarra MRN: [id number] Date of Birth: [birth year]  Transition of Care [facility]) CM/SW Contact:    Clearance Coots, LCSW Phone Number: 01/20/2020, 3:53 PM  Clinical Narrative:                 PCP appointment scheduled at the Patient Care Center. First available appointment February 18, 2020 at 9:20am. Information written on AVS. Patient report understanding.   Expected Discharge Plan: Home/Self Care Barriers to Discharge: No Barriers Identified   Patient Goals and CMS Choice     Choice offered to / list presented to : NA  Expected Discharge Plan and Services Expected Discharge Plan: Home/Self Care In-house Referral: NA Discharge Planning Services: Follow-up appt scheduled Post Acute Care Choice: NA Living arrangements for the past 2 months: Single Family Home                 DME Arranged: N/A DME Agency: NA       HH Arranged: NA HH Agency: NA        Prior Living Arrangements/Services Living arrangements for the past 2 months: Single Family Home   Patient language and need for interpreter reviewed:: No Do you feel safe going back to the place where you live?: Yes      Need for Family Participation in Patient Care: No (Comment) Care giver support system in place?: Yes (comment)   Criminal Activity/Legal Involvement Pertinent to Current Situation/Hospitalization: No - Comment as needed  Activities of Daily Living Home Assistive Devices/Equipment: None ADL Screening (condition at time of admission) Patient's cognitive ability adequate to safely complete daily activities?: Yes Is the patient deaf or have difficulty hearing?: No Does the patient have difficulty seeing, even when wearing glasses/contacts?: No Does the patient have difficulty concentrating, remembering, or making decisions?: No Patient able to express need for assistance with ADLs?: Yes Does the patient have difficulty dressing  or bathing?: No Independently performs ADLs?: Yes (appropriate for developmental age) Does the patient have difficulty walking or climbing stairs?: No Weakness of Legs: None Weakness of Arms/Hands: None  Permission Sought/Granted Permission sought to share information with : Case Manager Permission granted to share information with : Yes, Verbal Permission Granted     Permission granted to share info w AGENCY: Patient care center        Emotional Assessment Appearance:: Appears stated age   Affect (typically observed): Flat Orientation: : Oriented to Self, Oriented to Place, Oriented to  Time, Oriented to Situation Alcohol / Substance Use: Not Applicable Psych Involvement: No (comment)  Admission diagnosis:  Epigastric pain [R10.13] Cholelithiasis [K80.20] Patient Active Problem List   Diagnosis Date Noted  . Cholelithiasis 01/19/2020   PCP:  Patient, No Pcp Per Pharmacy:   [id number] - [place], [place] - [address] AT [facility] [address] [place] [address] Phone: [phone] Fax: [phone]     Social Determinants of Health (SDOH) Interventions    Readmission Risk Interventions No flowsheet data found.

## 2020-01-20 NOTE — Plan of Care (Signed)

## 2020-01-21 LAB — GLUCOSE, CAPILLARY
Glucose-Capillary: 241 mg/dL — ABNORMAL HIGH (ref 70–99)
Glucose-Capillary: 186 mg/dL — ABNORMAL HIGH (ref 70–99)

## 2020-01-21 LAB — SURGICAL PATHOLOGY

## 2020-01-21 MED ORDER — ACETAMINOPHEN 500 MG PO TABS
1000.0000 mg | ORAL_TABLET | Freq: Three times a day (TID) | ORAL | Status: DC
  Administered 2020-01-21: 1000 mg via ORAL
  Filled 2020-01-21: qty 2

## 2020-01-21 MED ORDER — ACETAMINOPHEN 500 MG PO TABS: 1000.0000 mg | ORAL_TABLET | Freq: Three times a day (TID) | ORAL | 0 refills | Status: AC | PRN

## 2020-01-21 MED ORDER — ACETAMINOPHEN 650 MG RE SUPP: 650.0000 mg | Freq: Three times a day (TID) | RECTAL | Status: DC

## 2020-01-21 MED ORDER — POLYETHYLENE GLYCOL 3350 17 G PO PACK: 17.0000 g | PACK | Freq: Every day | ORAL | 0 refills | Status: AC | PRN

## 2020-01-21 MED ORDER — OXYCODONE HCL 5 MG PO TABS: 5.0000 mg | ORAL_TABLET | Freq: Four times a day (QID) | ORAL | 0 refills | Status: AC | PRN

## 2020-01-21 MED ORDER — METHOCARBAMOL 500 MG PO TABS: 500.0000 mg | ORAL_TABLET | Freq: Four times a day (QID) | ORAL | Status: DC | PRN

## 2020-01-21 MED ORDER — MORPHINE SULFATE (PF) 2 MG/ML IV SOLN: 2.0000 mg | INTRAVENOUS | Status: DC | PRN

## 2020-01-21 MED ORDER — OXYCODONE HCL 5 MG PO TABS
5.0000 mg | ORAL_TABLET | ORAL | Status: DC | PRN
  Administered 2020-01-21: 10 mg via ORAL
  Filled 2020-01-21: qty 2

## 2020-01-21 MED ORDER — KETOROLAC TROMETHAMINE 15 MG/ML IJ SOLN: 15.0000 mg | Freq: Four times a day (QID) | INTRAMUSCULAR | Status: DC | PRN

## 2020-01-21 NOTE — Discharge Summary (Signed)
Allison Ibarra   Patient ID: Allison Ibarra MRN: [id number] DOB/AGE: 1981-04-13 39 y.o.  Admit date: 01/19/2020 Discharge date: 01/21/2020  Admitting Diagnosis: Symptomatic cholelithiasis, ?early acute cholecystitis Mild transaminitis  Discharge Diagnosis Calculus of gallbladder with acute cholecystitis, without mention of obstruction DM (A1c 12)  Consultants None  Imaging: DG Cholangiogram Operative  Result Date: 01/20/2020 CLINICAL DATA:  39 year old female with a history cholelithiasis EXAM: INTRAOPERATIVE CHOLANGIOGRAM TECHNIQUE: Cholangiographic images from the C-arm fluoroscopic device were submitted for interpretation post-operatively. Please see the procedural report for the amount of contrast and the fluoroscopy time utilized. COMPARISON:  Ultrasound 01/08/2020 FINDINGS: Surgical instruments project over the upper abdomen. There is cannulation of the cystic duct/gallbladder neck, with antegrade infusion of contrast. Caliber of the extrahepatic ductal system within normal limits. No definite filling defect within the extrahepatic ducts identified. Free flow of contrast across the ampulla. IMPRESSION: Intraoperative cholangiogram demonstrates extrahepatic biliary ducts of unremarkable caliber, with no definite filling defects identified. Free flow of contrast across the ampulla. Please refer to the dictated operative report for full details of intraoperative findings and procedure Electronically Signed   By: Corrie Mckusick D.O.   On: 01/20/2020 13:20    Procedures Dr. Brantley Stage (01/20/2020) - Laparoscopic Cholecystectomy with [place] Hospital Course:  Allison Ibarra is a 39yo female who presented to [facility] 5/3 with recurrent epigastric pain. She was in the ED 2 weeks prior with similar symptoms and found to have gallstones. Pain returned so she came back to the ED. Workup showed AST 156, ALT 56, Alk phos 132, Tbilirubin 1.0, WBC 9.1, lipase 43. U/s was not repeated but on  4/22 is showed cholecystolithiasis without sonographic evidence of acute cholecystitis, possible mild intrahepatic biliary ductal dilatation, and mild dilation of the visualized common duct measuring up to 8 mm in diameter.  Patient was admitted and underwent procedure listed above.  IOC negative. Tolerated procedure well and was transferred to the floor.  Diet was advanced as tolerated.  A1c noted to be 12; patient encouraged to restart metformin and follow up was made with a primary care physician. On POD1 the patient was voiding well, tolerating diet, ambulating well, pain well controlled, vital signs stable, incisions c/d/i and felt stable for discharge home.  Patient will follow up as below and knows to call with questions or concerns.    I have personally reviewed the patients medication history on the [place] controlled substance database.    Physical Exam: General:  Alert, NAD, pleasant, comfortable Pulm: rate and effort normal Abd:  Soft, ND, appropriately tender, multiple lap incisions C/D/I  Allergies as of 01/21/2020      Reactions   Amoxicillin Itching, Rash      Medication List    TAKE these medications   acetaminophen 500 MG tablet Commonly known as: TYLENOL Take 2 tablets (1,000 mg total) by mouth every 8 (eight) hours as needed for mild pain.   metFORMIN 500 MG tablet Commonly known as: GLUCOPHAGE Take 1 tablet (500 mg total) by mouth 2 (two) times daily with a meal.   oxyCODONE 5 MG immediate release tablet Commonly known as: Oxy IR/ROXICODONE Take 1 tablet (5 mg total) by mouth every 6 (six) hours as needed for severe pain.   pantoprazole 20 MG tablet Commonly known as: PROTONIX Take 1 tablet (20 mg total) by mouth daily.   polyethylene glycol 17 g packet Commonly known as: MIRALAX / GLYCOLAX Take 17 g by mouth daily as needed for mild constipation.  Follow-up [facility] [place]. Call.   Specialty: General Surgery Why: Please  arrive 30 minutes prior to your appointment to check in and fill out paperwork. Bring photo ID and insurance information. Contact information: [address] [place]. Call on 02/18/2020.   Specialty: Internal Medicine Why: Establish Primary Care. Appointment time: 9:20am Please arrive 30 minutes prior to check in and fill out papework.  Contact information: [place] [phone] [place] [phone]          Signed: [place], [facility] 01/21/2020, 10:06 AM Please see Amion for pager number during day hours 7:00am-4:30pm

## 2020-01-21 NOTE — Progress Notes (Signed)
Patient discharged to home. Given all belongings, instructions. Verbalized understanding of all instructions. Escorted to pov via w/c. 

## 2020-02-18 ENCOUNTER — Ambulatory Visit: Payer: Self-pay | Admitting: Family Medicine

## 2020-12-19 ENCOUNTER — Emergency Department (HOSPITAL_BASED_OUTPATIENT_CLINIC_OR_DEPARTMENT_OTHER): Payer: Medicaid Other

## 2020-12-19 ENCOUNTER — Other Ambulatory Visit: Payer: Self-pay

## 2020-12-19 ENCOUNTER — Emergency Department (HOSPITAL_BASED_OUTPATIENT_CLINIC_OR_DEPARTMENT_OTHER)
Admission: EM | Admit: 2020-12-19 | Discharge: 2020-12-19 | Disposition: A | Discharge status: Home / Self Care | Payer: Medicaid Other | Attending: Emergency Medicine | Admitting: Emergency Medicine

## 2020-12-19 ENCOUNTER — Encounter (HOSPITAL_BASED_OUTPATIENT_CLINIC_OR_DEPARTMENT_OTHER): Payer: Self-pay | Admitting: Emergency Medicine

## 2020-12-19 DIAGNOSIS — F17210 Nicotine dependence, cigarettes, uncomplicated: Secondary | ICD-10-CM | POA: Diagnosis not present

## 2020-12-19 DIAGNOSIS — M940 Chondrocostal junction syndrome [Tietze]: Secondary | ICD-10-CM

## 2020-12-19 DIAGNOSIS — R079 Chest pain, unspecified: Secondary | ICD-10-CM

## 2020-12-19 DIAGNOSIS — E119 Type 2 diabetes mellitus without complications: Secondary | ICD-10-CM | POA: Diagnosis not present

## 2020-12-19 DIAGNOSIS — Z7984 Long term (current) use of oral hypoglycemic drugs: Secondary | ICD-10-CM | POA: Insufficient documentation

## 2020-12-19 LAB — BASIC METABOLIC PANEL
Anion gap: 9 (ref 5–15)
BUN: 12 mg/dL (ref 6–20)
CO2: 26 mmol/L (ref 22–32)
Calcium: 9.2 mg/dL (ref 8.9–10.3)
Chloride: 95 mmol/L — ABNORMAL LOW (ref 98–111)
Creatinine, Ser: 0.84 mg/dL (ref 0.44–1.00)
GFR, Estimated: >60 mL/min (ref >60)
Glucose, Bld: 312 mg/dL — ABNORMAL HIGH (ref 70–99)
Potassium: 4 mmol/L (ref 3.5–5.1)
Sodium: 130 mmol/L — ABNORMAL LOW (ref 135–145)

## 2020-12-19 LAB — CBC
HCT: 46.8 % — ABNORMAL HIGH (ref 36.0–46.0)
Hemoglobin: 15.9 g/dL — ABNORMAL HIGH (ref 12.0–15.0)
MCH: 31.9 pg (ref 26.0–34.0)
MCHC: 34 g/dL (ref 30.0–36.0)
MCV: 94 fL (ref 80.0–100.0)
Platelets: 222 10*3/uL (ref 150–400)
RBC: 4.98 MIL/uL (ref 3.87–5.11)
RDW: 11.9 % (ref 11.5–15.5)
WBC: 6.8 10*3/uL (ref 4.0–10.5)
nRBC: 0 % (ref 0.0–0.2)

## 2020-12-19 LAB — PREGNANCY, URINE: Preg Test, Ur: NEGATIVE

## 2020-12-19 LAB — TROPONIN I (HIGH SENSITIVITY): Troponin I (High Sensitivity): <2 ng/L (ref <18)

## 2020-12-19 LAB — CBG MONITORING, ED: Glucose-Capillary: 287 mg/dL — ABNORMAL HIGH (ref 70–99)

## 2020-12-19 MED ORDER — IBUPROFEN 600 MG PO TABS: 600.0000 mg | ORAL_TABLET | Freq: Four times a day (QID) | ORAL | 0 refills | Status: AC | PRN

## 2020-12-19 MED ORDER — KETOROLAC TROMETHAMINE 30 MG/ML IJ SOLN
30.0000 mg | Freq: Once | INTRAMUSCULAR | Status: AC
  Administered 2020-12-19: 30 mg via INTRAVENOUS
  Filled 2020-12-19: qty 1

## 2020-12-19 NOTE — Discharge Instructions (Signed)
Please read the attachment on costochondritis.  I suspect that your symptoms are musculoskeletal, particularly given that they are reproducible on exam with palpation.  I suspect that it was precipitated by your smoker's cough.  It is important that you discontinue smoking, best to do it now as it is easiest to quit while you are young and otherwise healthy.  Please take the ibuprofen as prescribed.  Do not combine with other NSAIDs.  Discontinue should you become pregnant or breastfeeding.  Please follow-up with your primary care provider at [facility] regarding today's encounter for ongoing evaluation and management.    Return to the ED or seek immediate medical attention should you experience any new or worsening symptoms.
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
## 2020-12-19 NOTE — ED Triage Notes (Addendum)
Pt presents to ED POV. Pt c/o intermittent CP that began yesterday. Pain is central and radiates towards back. Pt report that pain worsens w/ rest and describes as stabbing. No cardiac hx

## 2021-02-28 ENCOUNTER — Emergency Department (HOSPITAL_BASED_OUTPATIENT_CLINIC_OR_DEPARTMENT_OTHER)
Admission: EM | Admit: 2021-02-28 | Discharge: 2021-02-28 | Disposition: A | Discharge status: Home / Self Care | Payer: Medicaid Other | Attending: Emergency Medicine | Admitting: Emergency Medicine

## 2021-02-28 ENCOUNTER — Other Ambulatory Visit: Payer: Self-pay

## 2021-02-28 ENCOUNTER — Encounter (HOSPITAL_BASED_OUTPATIENT_CLINIC_OR_DEPARTMENT_OTHER): Payer: Self-pay | Admitting: *Deleted

## 2021-02-28 DIAGNOSIS — F17210 Nicotine dependence, cigarettes, uncomplicated: Secondary | ICD-10-CM | POA: Diagnosis not present

## 2021-02-28 DIAGNOSIS — E119 Type 2 diabetes mellitus without complications: Secondary | ICD-10-CM | POA: Insufficient documentation

## 2021-02-28 DIAGNOSIS — K0889 Other specified disorders of teeth and supporting structures: Secondary | ICD-10-CM | POA: Diagnosis present

## 2021-02-28 IMAGING — DX DG RIBS W/ CHEST 3+V*R*
3 series · 3 of 3 positions shown · non-contrast
Comparison: 06/20/2017

CLINICAL DATA: Right posterior rib pain and right flank pain since
2 a.m.

EXAM:
RIGHT RIBS AND CHEST - 3+ VIEW

[chest pa]
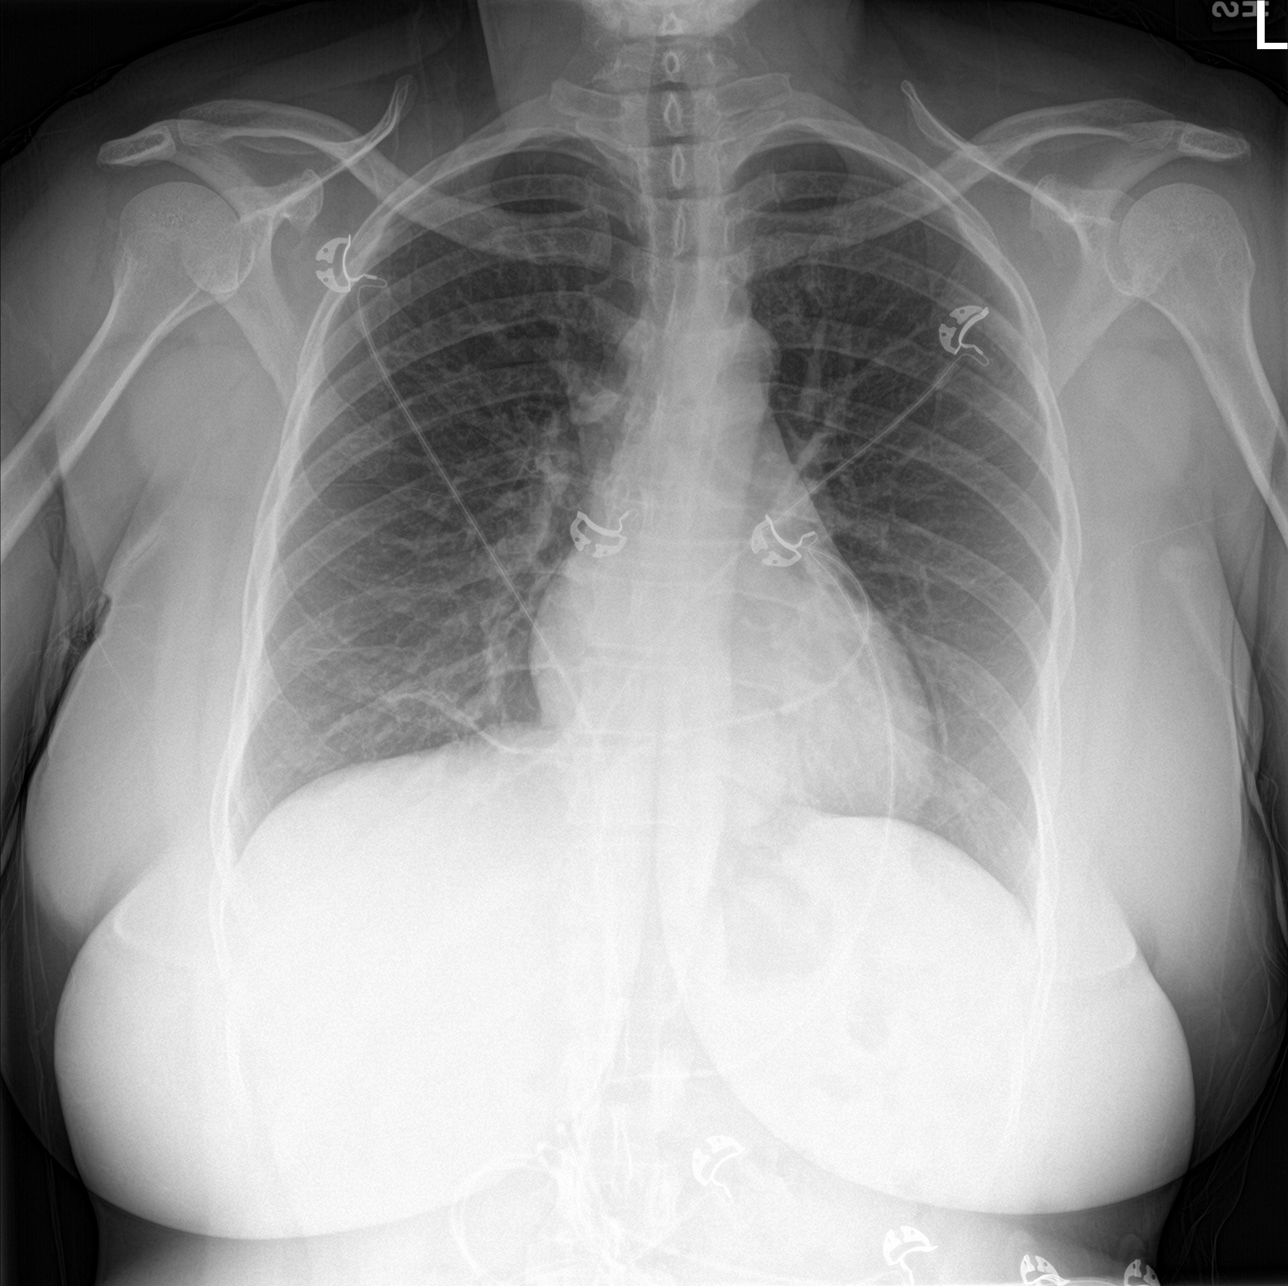

[rib ap]
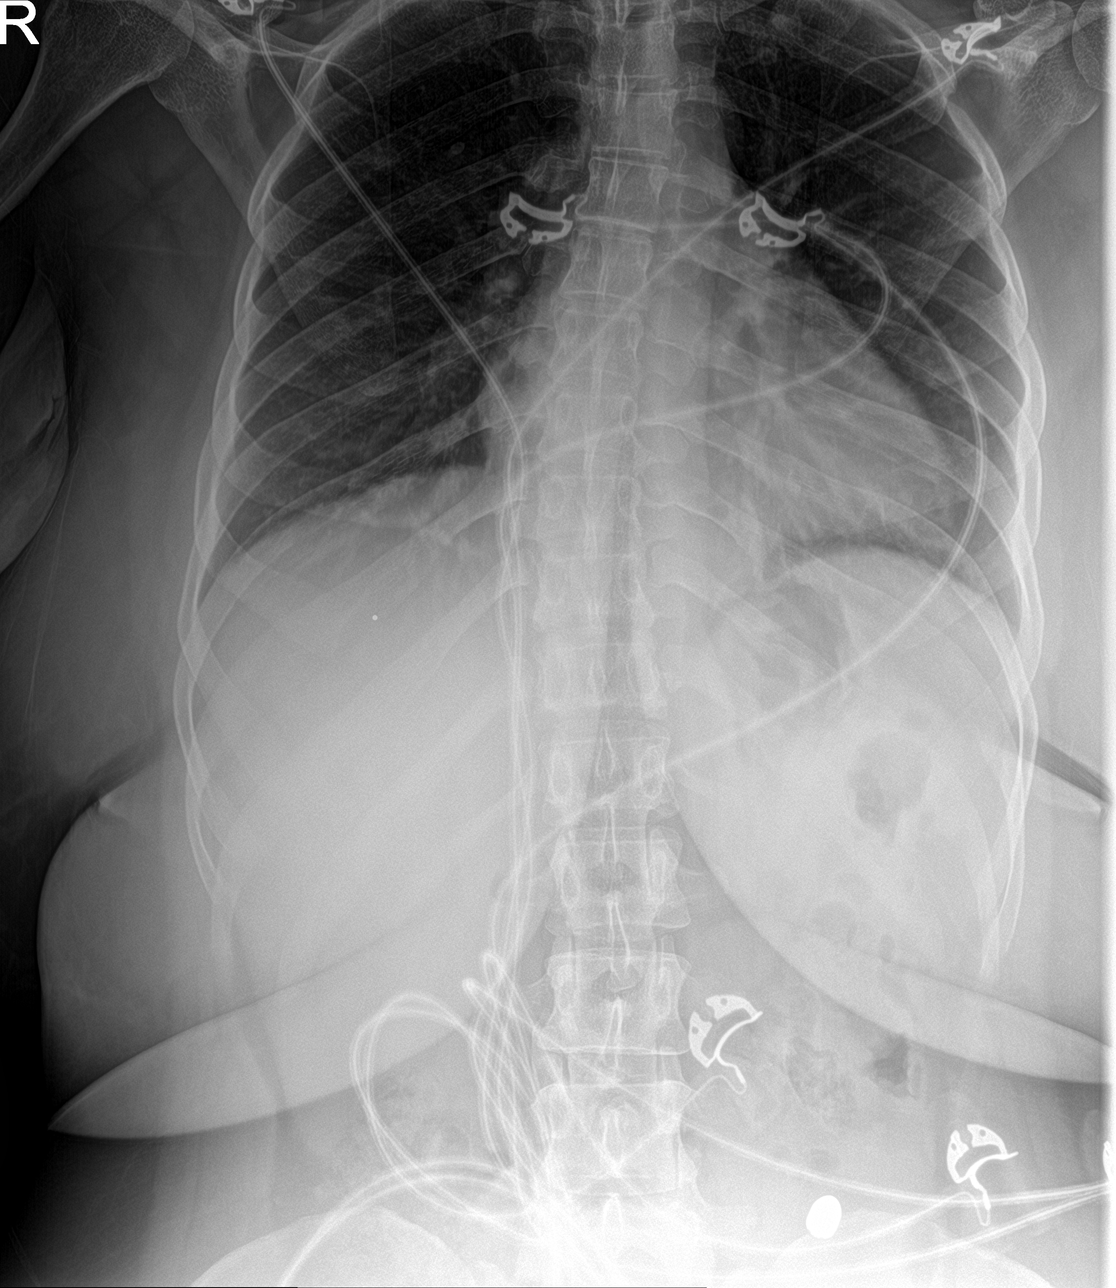

[rib ap obl]
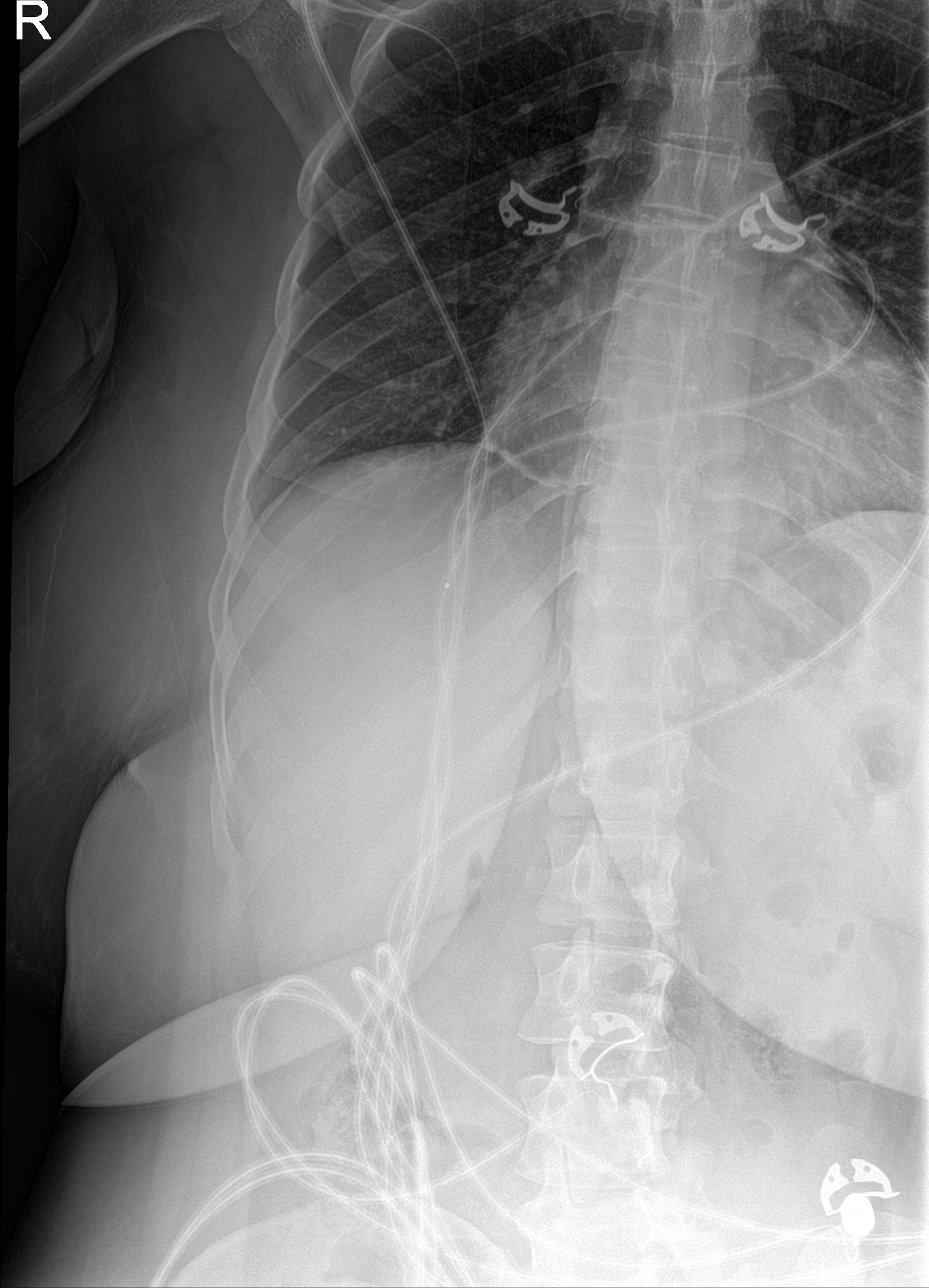

[3 of 3 positions shown; findings below may reference images not displayed]

FINDINGS: Frontal and oblique views of the right thoracic cage are obtained.
The cardiac silhouette is unremarkable. No airspace disease,
effusion, or pneumothorax. There are no acute displaced rib
fractures.
IMPRESSION: 1. No acute intrathoracic process.
2. No acute fracture.

## 2021-02-28 MED ORDER — CLINDAMYCIN HCL 150 MG PO CAPS
150.0000 mg | ORAL_CAPSULE | Freq: Once | ORAL | Status: AC
  Administered 2021-02-28: 150 mg via ORAL
  Filled 2021-02-28: qty 1

## 2021-02-28 MED ORDER — NAPROXEN 250 MG PO TABS
500.0000 mg | ORAL_TABLET | Freq: Once | ORAL | Status: AC
  Administered 2021-02-28: 500 mg via ORAL
  Filled 2021-02-28: qty 2

## 2021-02-28 MED ORDER — NAPROXEN 500 MG PO TABS: 500.0000 mg | ORAL_TABLET | Freq: Two times a day (BID) | ORAL | 0 refills | Status: AC

## 2021-02-28 MED ORDER — CLINDAMYCIN HCL 150 MG PO CAPS: 150.0000 mg | ORAL_CAPSULE | Freq: Four times a day (QID) | ORAL | 0 refills | Status: AC

## 2021-02-28 NOTE — Discharge Instructions (Addendum)
You were evaluated in the Emergency Department and after careful evaluation, we did not find any emergent condition requiring admission or further testing in the hospital.  Your exam/testing today is overall reassuring.  Symptoms seem to be due to a tooth infection.  Please take the clindamycin antibiotic as directed.  Use the Naprosyn provided for pain.  Recommend following up with dentistry.  Please return to the Emergency Department if you experience any worsening of your condition.   Thank you for allowing [place] to be a part of your care.

## 2021-02-28 NOTE — ED Triage Notes (Signed)
Upper right tooth pain x 3 days
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
## 2021-09-01 ENCOUNTER — Emergency Department (HOSPITAL_BASED_OUTPATIENT_CLINIC_OR_DEPARTMENT_OTHER)
Admission: EM | Admit: 2021-09-01 | Discharge: 2021-09-01 | Disposition: A | Discharge status: Home / Self Care | Payer: Medicaid Other | Attending: Emergency Medicine | Admitting: Emergency Medicine

## 2021-09-01 ENCOUNTER — Encounter (HOSPITAL_BASED_OUTPATIENT_CLINIC_OR_DEPARTMENT_OTHER): Payer: Self-pay

## 2021-09-01 ENCOUNTER — Other Ambulatory Visit: Payer: Self-pay

## 2021-09-01 DIAGNOSIS — F17210 Nicotine dependence, cigarettes, uncomplicated: Secondary | ICD-10-CM | POA: Diagnosis not present

## 2021-09-01 DIAGNOSIS — E119 Type 2 diabetes mellitus without complications: Secondary | ICD-10-CM | POA: Diagnosis not present

## 2021-09-01 DIAGNOSIS — R21 Rash and other nonspecific skin eruption: Secondary | ICD-10-CM | POA: Diagnosis present

## 2021-09-01 DIAGNOSIS — L240 Irritant contact dermatitis due to detergents: Secondary | ICD-10-CM | POA: Insufficient documentation

## 2021-09-01 MED ORDER — TRIAMCINOLONE ACETONIDE 0.1 % EX CREA: 1.0000 "application " | TOPICAL_CREAM | Freq: Two times a day (BID) | CUTANEOUS | 0 refills | Status: DC

## 2021-09-01 NOTE — ED Triage Notes (Signed)
Pt c/o scattered rash x 3 days-NAD-steady gait
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
## 2021-09-01 NOTE — Discharge Instructions (Addendum)
You can use triamcinolone cream up to twice daily to the affected areas -- limit use to one week on face and buttocks. Up to two weeks on body. Use vaseline, cetaphil, cerave or other moisturizing cream as often as you would like to help keep areas from drying out.  If rash persists I recommend follow up with dermatology.

## 2021-09-01 NOTE — ED Notes (Signed)
States new laundry detergent used that has made her have a rash previously

## 2021-09-19 ENCOUNTER — Encounter (HOSPITAL_BASED_OUTPATIENT_CLINIC_OR_DEPARTMENT_OTHER): Payer: Self-pay

## 2021-09-19 ENCOUNTER — Other Ambulatory Visit: Payer: Self-pay

## 2021-09-19 ENCOUNTER — Emergency Department (HOSPITAL_BASED_OUTPATIENT_CLINIC_OR_DEPARTMENT_OTHER)
Admission: EM | Admit: 2021-09-19 | Discharge: 2021-09-19 | Disposition: A | Discharge status: Home / Self Care | Payer: Medicaid Other | Attending: Emergency Medicine | Admitting: Emergency Medicine

## 2021-09-19 DIAGNOSIS — R21 Rash and other nonspecific skin eruption: Secondary | ICD-10-CM | POA: Insufficient documentation

## 2021-09-19 DIAGNOSIS — E119 Type 2 diabetes mellitus without complications: Secondary | ICD-10-CM | POA: Insufficient documentation

## 2021-09-19 MED ORDER — PREDNISONE 10 MG (21) PO TBPK: ORAL_TABLET | Freq: Every day | ORAL | 0 refills | Status: AC

## 2021-09-19 MED ORDER — PREDNISONE 50 MG PO TABS
60.0000 mg | ORAL_TABLET | Freq: Once | ORAL | Status: AC
  Administered 2021-09-19: 60 mg via ORAL
  Filled 2021-09-19: qty 1
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
## 2021-09-19 NOTE — Discharge Instructions (Addendum)
You are seen in the emergency department today for rash.  As we discussed this and it looks most consistent with an allergic reaction to something.  I have given you a dose of steroids in the emergency department, and I am prescribing you steroids to take at home.  These follow a taper, so please follow package instructions for how to take them.  I like you to follow-up with your primary doctor, to discuss a referral to the allergist.  They can dive further into your symptoms, and test you for the most common allergens.  Steroids can increase your blood sugar, so keep a close eye on this.  Continue to monitor how you're doing and return to the ER for new or worsening symptoms such as more redness, tenderness, or weeping from your rash.   It has been a pleasure seeing and caring for you today and I hope you start feeling better soon!

## 2021-09-19 NOTE — ED Triage Notes (Signed)
Pt c/o scattered rash x weeks-states she was seen here for same-no better after rx prednisone-NAD-steady gait

## 2021-10-09 ENCOUNTER — Other Ambulatory Visit: Payer: Self-pay

## 2021-10-09 ENCOUNTER — Emergency Department (HOSPITAL_BASED_OUTPATIENT_CLINIC_OR_DEPARTMENT_OTHER)
Admission: EM | Admit: 2021-10-09 | Discharge: 2021-10-09 | Disposition: A | Discharge status: Home / Self Care | Payer: Medicaid Other | Attending: Emergency Medicine | Admitting: Emergency Medicine

## 2021-10-09 ENCOUNTER — Encounter (HOSPITAL_BASED_OUTPATIENT_CLINIC_OR_DEPARTMENT_OTHER): Payer: Self-pay | Admitting: Emergency Medicine

## 2021-10-09 DIAGNOSIS — E1165 Type 2 diabetes mellitus with hyperglycemia: Secondary | ICD-10-CM | POA: Insufficient documentation

## 2021-10-09 DIAGNOSIS — N390 Urinary tract infection, site not specified: Secondary | ICD-10-CM | POA: Diagnosis not present

## 2021-10-09 DIAGNOSIS — Z7984 Long term (current) use of oral hypoglycemic drugs: Secondary | ICD-10-CM | POA: Insufficient documentation

## 2021-10-09 DIAGNOSIS — R739 Hyperglycemia, unspecified: Secondary | ICD-10-CM

## 2021-10-09 DIAGNOSIS — R1013 Epigastric pain: Secondary | ICD-10-CM | POA: Diagnosis not present

## 2021-10-09 DIAGNOSIS — E119 Type 2 diabetes mellitus without complications: Secondary | ICD-10-CM

## 2021-10-09 DIAGNOSIS — D72829 Elevated white blood cell count, unspecified: Secondary | ICD-10-CM | POA: Insufficient documentation

## 2021-10-09 DIAGNOSIS — Z3202 Encounter for pregnancy test, result negative: Secondary | ICD-10-CM | POA: Diagnosis not present

## 2021-10-09 LAB — BASIC METABOLIC PANEL
Anion gap: 9 (ref 5–15)
BUN: 10 mg/dL (ref 6–20)
CO2: 23 mmol/L (ref 22–32)
Calcium: 8.4 mg/dL — ABNORMAL LOW (ref 8.9–10.3)
Chloride: 102 mmol/L (ref 98–111)
Creatinine, Ser: 0.8 mg/dL (ref 0.44–1.00)
GFR, Estimated: >60 mL/min (ref >60)
Glucose, Bld: 201 mg/dL — ABNORMAL HIGH (ref 70–99)
Potassium: 3.4 mmol/L — ABNORMAL LOW (ref 3.5–5.1)
Sodium: 134 mmol/L — ABNORMAL LOW (ref 135–145)

## 2021-10-09 LAB — URINALYSIS, ROUTINE W REFLEX MICROSCOPIC
Glucose, UA: >=500 mg/dL — AB
Ketones, ur: NEGATIVE mg/dL
Leukocytes,Ua: NEGATIVE
Nitrite: NEGATIVE
Protein, ur: 30 mg/dL — AB
Specific Gravity, Urine: >=1.03 (ref 1.005–1.030)
pH: 6 (ref 5.0–8.0)

## 2021-10-09 LAB — PREGNANCY, URINE: Preg Test, Ur: NEGATIVE

## 2021-10-09 LAB — CBC
HCT: 40.8 % (ref 36.0–46.0)
Hemoglobin: 14.2 g/dL (ref 12.0–15.0)
MCH: 32.5 pg (ref 26.0–34.0)
MCHC: 34.8 g/dL (ref 30.0–36.0)
MCV: 93.4 fL (ref 80.0–100.0)
Platelets: 173 10*3/uL (ref 150–400)
RBC: 4.37 MIL/uL (ref 3.87–5.11)
RDW: 11.9 % (ref 11.5–15.5)
WBC: 5.9 10*3/uL (ref 4.0–10.5)
nRBC: 0 % (ref 0.0–0.2)

## 2021-10-09 LAB — URINALYSIS, MICROSCOPIC (REFLEX)

## 2021-10-09 LAB — CBG MONITORING, ED: Glucose-Capillary: 198 mg/dL — ABNORMAL HIGH (ref 70–99)

## 2021-10-09 MED ORDER — ONDANSETRON 4 MG PO TBDP: 4.0000 mg | ORAL_TABLET | Freq: Three times a day (TID) | ORAL | 0 refills | Status: AC | PRN

## 2021-10-09 NOTE — ED Provider Notes (Addendum)
[place] EMERGENCY DEPARTMENT Provider Note   CSN: [id number] Arrival date & time: 10/09/21  1141     History  Chief Complaint  Patient presents with   Hyperglycemia    Allison Ibarra is a 41 y.o. female.  Patient with a known history of diabetes.  Patient with a complaint of epigastric abdominal pain and feeling kind of weak and nauseated last evening after eating and drinking a new type of diet drink.  Patient checked her blood sugar and was 585.  EMS was called to the house for blood sugar reading was high per patient did not go to the ER due to extended wait times.  Patient did not check her blood sugar this morning.  Has not had anything to eat.  Does not take any of her medicines.  Patient's last dose of her blood sugar medication was yesterday.  Patient is on glipizide.  Patient is not on insulin.  Past medical history is significant for diabetes mellitus without complications.  And she has had an appendectomy and cholecystectomy.  Gallbladder was removed in May 2021.  Patient denies any chest pain any significant abdominal pain any vomiting or diarrhea.      Home Medications Prior to Admission medications   Medication Sig Start Date End Date Taking? Authorizing Provider  acetaminophen (TYLENOL) 500 MG tablet Take 2 tablets (1,000 mg total) by mouth every 8 (eight) hours as needed for mild pain. 01/21/20   Meuth, Brooke A, PA-C  clindamycin (CLEOCIN) 150 MG capsule Take 1 capsule (150 mg total) by mouth every 6 (six) hours. 02/28/21   Maudie Flakes, MD  ibuprofen (ADVIL) 600 MG tablet Take 1 tablet (600 mg total) by mouth every 6 (six) hours as needed. 12/19/20   Corena Herter, PA-C  metFORMIN (GLUCOPHAGE) 500 MG tablet Take 1 tablet (500 mg total) by mouth 2 (two) times daily with a meal. Patient not taking: Reported on 01/20/2020 01/08/20   Rayna Sexton, PA-C  naproxen (NAPROSYN) 500 MG tablet Take 1 tablet (500 mg total) by mouth 2 (two) times daily. 02/28/21   Maudie Flakes, MD  oxyCODONE (OXY IR/ROXICODONE) 5 MG immediate release tablet Take 1 tablet (5 mg total) by mouth every 6 (six) hours as needed for severe pain. 01/21/20   Meuth, Brooke A, PA-C  pantoprazole (PROTONIX) 20 MG tablet Take 1 tablet (20 mg total) by mouth daily. 01/08/20   Rayna Sexton, PA-C  polyethylene glycol (MIRALAX / GLYCOLAX) 17 g packet Take 17 g by mouth daily as needed for mild constipation. 01/21/20   Meuth, Brooke A, PA-C  predniSONE (STERAPRED UNI-PAK 21 TAB) 10 MG (21) TBPK tablet Take by mouth daily. Take 6 tabs by mouth daily  for 2 days, then 5 tabs for 2 days, then 4 tabs for 2 days, then 3 tabs for 2 days, 2 tabs for 2 days, then 1 tab by mouth daily for 2 days 09/19/21   Roemhildt, Lorin T, PA-C  triamcinolone cream (KENALOG) 0.1 % Apply 1 application topically 2 (two) times daily. Limit use on body to two weeks, limit use on buttock, face to one week 09/01/21   Prosperi, Christian H, PA-C      Allergies    Amoxicillin    Review of Systems   Review of Systems  Constitutional:  Negative for chills and fever.  HENT:  Negative for ear pain and sore throat.   Eyes:  Negative for pain and visual disturbance.  Respiratory:  Negative for  cough and shortness of breath.   Cardiovascular:  Negative for chest pain and palpitations.  Gastrointestinal:  Positive for abdominal pain and nausea. Negative for vomiting.  Genitourinary:  Negative for dysuria and hematuria.  Musculoskeletal:  Negative for arthralgias and back pain.  Skin:  Negative for color change and rash.  Neurological:  Negative for seizures and syncope.  All other systems reviewed and are negative.  Physical Exam Updated Vital Signs BP 105/75    Pulse 85    Temp 98.4 F (36.9 C) (Oral)    Resp 18    Ht 1.676 m (5\' 6" )    Wt 86.6 kg    LMP 10/09/2021    SpO2 99%    BMI 30.83 kg/m  Physical Exam Vitals and nursing note reviewed.  Constitutional:      General: She is not in acute distress.    Appearance:  Normal appearance. She is well-developed.  HENT:     Head: Normocephalic and atraumatic.  Eyes:     Extraocular Movements: Extraocular movements intact.     Conjunctiva/sclera: Conjunctivae normal.     Pupils: Pupils are equal, round, and reactive to light.  Cardiovascular:     Rate and Rhythm: Normal rate and regular rhythm.     Heart sounds: No murmur heard. Pulmonary:     Effort: Pulmonary effort is normal. No respiratory distress.     Breath sounds: Normal breath sounds.  Abdominal:     General: There is no distension.     Palpations: Abdomen is soft.     Tenderness: There is no abdominal tenderness. There is no guarding.  Musculoskeletal:        General: No swelling.     Cervical back: Neck supple.  Skin:    General: Skin is warm and dry.     Capillary Refill: Capillary refill takes less than 2 seconds.  Neurological:     General: No focal deficit present.     Mental Status: She is alert and oriented to person, place, and time.  Psychiatric:        Mood and Affect: Mood normal.    ED Results / Procedures / Treatments   Labs (all labs ordered are listed, but only abnormal results are displayed) Labs Reviewed  CBG MONITORING, ED - Abnormal; Notable for the following components:      Result Value   Glucose-Capillary 198 (*)    All other components within normal limits  PREGNANCY, URINE  BASIC METABOLIC PANEL  CBC  URINALYSIS, ROUTINE W REFLEX MICROSCOPIC    EKG None  Radiology No results found.  Procedures Procedures    Medications Ordered in ED Medications - No data to display  ED Course/ Medical Decision Making/ A&P                           Medical Decision Making Amount and/or Complexity of Data Reviewed Labs: ordered.   Patient's fingerstick blood sugar here is 198.  Which is reassuring.  Patient has CBC basic metabolic panel urinalysis that is pending.  Pregnancy test was negative.  Patient's blood sugar here officially 201.  CO2 is at 23 no  signs of acidosis.  Potassium a little borderline at 3.4.  White blood cell count 5.9 no leukocytosis hemoglobin normal at 14.2.  Pregnancy test was negative urinalysis did have some bacteria and it listed as many bacteria.  But did not have an elevation in the WBCs.  We will send urine  for culture.  Patient will be notified if the culture is suggestive of urinary tract infection.  The patient does not have any symptoms in that area.  Will give patient prescription for Zofran.  Patient is okay with this plan.  Overall she is feeling better and reassured.    Final Clinical Impression(s) / ED Diagnoses Final diagnoses:  Hyperglycemia  Type 2 diabetes mellitus without complication, without long-term current use of insulin [facility])    Rx / DC Orders ED Discharge Orders     None         Fredia Sorrow, MD 10/09/21 1300    Fredia Sorrow, MD 10/09/21 1424    Fredia Sorrow, MD 10/09/21 1424

## 2021-10-09 NOTE — ED Triage Notes (Signed)
Pt c/o after drinking a diet drink began to feel weak and sick on her stomach last night. Pt checked her blood sugar and it was 585, EMS called to the house where the glucose reading was high. Per patient,  did not go to ER due to extended wait time. Patient did not check her blood sugar today. Patient last does of her blood sugar medication was yesterday.

## 2021-10-09 NOTE — Discharge Instructions (Signed)
Take the Zofran as needed for nausea vomiting.  As I mentioned your urine had some bacteria in it so sent for culture.  You will be notified if this is consistent with urinary tract infection.  Plan to follow-up with your primary care doctor.  Return for any new or worse symptoms.

## 2021-10-10 LAB — URINE CULTURE: Culture: NO GROWTH

## 2021-10-23 ENCOUNTER — Encounter (HOSPITAL_BASED_OUTPATIENT_CLINIC_OR_DEPARTMENT_OTHER): Payer: Self-pay | Admitting: Emergency Medicine

## 2021-10-23 ENCOUNTER — Other Ambulatory Visit: Payer: Self-pay

## 2021-10-23 ENCOUNTER — Emergency Department (HOSPITAL_BASED_OUTPATIENT_CLINIC_OR_DEPARTMENT_OTHER)
Admission: EM | Admit: 2021-10-23 | Discharge: 2021-10-23 | Disposition: A | Discharge status: Home / Self Care | Payer: Medicaid Other | Attending: Emergency Medicine | Admitting: Emergency Medicine

## 2021-10-23 DIAGNOSIS — Z7984 Long term (current) use of oral hypoglycemic drugs: Secondary | ICD-10-CM | POA: Diagnosis not present

## 2021-10-23 DIAGNOSIS — H6692 Otitis media, unspecified, left ear: Secondary | ICD-10-CM | POA: Diagnosis not present

## 2021-10-23 DIAGNOSIS — R21 Rash and other nonspecific skin eruption: Secondary | ICD-10-CM | POA: Insufficient documentation

## 2021-10-23 DIAGNOSIS — H60312 Diffuse otitis externa, left ear: Secondary | ICD-10-CM | POA: Diagnosis not present

## 2021-10-23 DIAGNOSIS — H6690 Otitis media, unspecified, unspecified ear: Secondary | ICD-10-CM

## 2021-10-23 DIAGNOSIS — H9202 Otalgia, left ear: Secondary | ICD-10-CM | POA: Diagnosis present

## 2021-10-23 LAB — URINALYSIS, MICROSCOPIC (REFLEX)

## 2021-10-23 LAB — URINALYSIS, ROUTINE W REFLEX MICROSCOPIC
Bilirubin Urine: NEGATIVE
Glucose, UA: >=500 mg/dL — AB
Hgb urine dipstick: NEGATIVE
Ketones, ur: NEGATIVE mg/dL
Leukocytes,Ua: NEGATIVE
Nitrite: NEGATIVE
Protein, ur: NEGATIVE mg/dL
Specific Gravity, Urine: 1.025 (ref 1.005–1.030)
pH: 6 (ref 5.0–8.0)

## 2021-10-23 MED ORDER — NEOMYCIN-POLYMYXIN-HC 3.5-10000-1 OT SOLN: 4.0000 [drp] | Freq: Four times a day (QID) | OTIC | 0 refills | Status: AC

## 2021-10-23 MED ORDER — CEFDINIR 300 MG PO CAPS: 300.0000 mg | ORAL_CAPSULE | Freq: Two times a day (BID) | ORAL | 0 refills | Status: AC

## 2021-10-23 MED ORDER — HYDROCORTISONE 1 % EX CREA: TOPICAL_CREAM | CUTANEOUS | 0 refills | Status: DC

## 2021-10-23 NOTE — Discharge Instructions (Addendum)
You were seen here today for evaluation of your ear pain and rash. I can see that you have been here several times for your rash. You will likely need to follow up with a dermatologist for this rash. I am concerned the rash on your buttocks is herpes although it is not actively oozing. You will most likely need another culture of this if you have another breakout. Please follow up with your PCP for a referral. I have prescribed you hydrocortisone cream to apply to the affected areas daily. Additionally, your ear exam is consistent with an outer and middle ear infection. Given this, I will prescribe you cortisporin drops to apply 4 drops in your ear four times a day. Additionally, you will need to take an oral antibiotic called Omnicef for the treatment of your middle ear infection. Again, I see that you have frequented the ER for evaluation of this. I have added the information to an ENT that you will need to call to schedule and appointment with. If you have any concern, new or worsening symptoms, please return to the nearest ER for re-evaluation.

## 2021-10-23 NOTE — ED Provider Notes (Signed)
[place] EMERGENCY DEPARTMENT Provider Note   CSN: [id number] Arrival date & time: 10/23/21  1113     History Chief Complaint  Patient presents with   Otalgia   Rash    Allison Ibarra is a 41 y.o. female with h/o DM presents to the ED for evaluation of her left ear pain and rash. She reports her left ear has been hurting for the past three days. The nursing note says decreased hearing, but she denied this on my evaluation. Denies any ear drainage, sore throat, rhinorrhea, or nasal congestion. The patient has been seen for this pain in the past few months. Additionally, she reports a rash to her chest, back, and buttocks for the past 4 months. She reports it has recent spread to her buttocks. She denies any genital involvement. She has been seen here multiple times in the past few months for this rash and reports she has tried the cream a few time without relief, but the rash is scabbing. She reports itching, but denies any pain. Denies any fevers, chills, weight loss, abdominal pain, nausea, vomiting. Denies any new lotions, detergents, body wash, soaps, or medicine.    Otalgia Associated symptoms: rash   Associated symptoms: no abdominal pain, no congestion, no cough, no diarrhea, no fever, no rhinorrhea, no sore throat and no vomiting   Rash Associated symptoms: no abdominal pain, no diarrhea, no fever, no myalgias, no nausea, no shortness of breath, no sore throat and not vomiting       Home Medications Prior to Admission medications   Medication Sig Start Date End Date Taking? Authorizing Provider  acetaminophen (TYLENOL) 500 MG tablet Take 2 tablets (1,000 mg total) by mouth every 8 (eight) hours as needed for mild pain. 01/21/20   Meuth, Brooke A, PA-C  clindamycin (CLEOCIN) 150 MG capsule Take 1 capsule (150 mg total) by mouth every 6 (six) hours. 02/28/21   Maudie Flakes, MD  ibuprofen (ADVIL) 600 MG tablet Take 1 tablet (600 mg total) by mouth every 6 (six) hours as  needed. 12/19/20   Corena Herter, PA-C  metFORMIN (GLUCOPHAGE) 500 MG tablet Take 1 tablet (500 mg total) by mouth 2 (two) times daily with a meal. Patient not taking: Reported on 01/20/2020 01/08/20   Rayna Sexton, PA-C  naproxen (NAPROSYN) 500 MG tablet Take 1 tablet (500 mg total) by mouth 2 (two) times daily. 02/28/21   Maudie Flakes, MD  ondansetron (ZOFRAN-ODT) 4 MG disintegrating tablet Take 1 tablet (4 mg total) by mouth every 8 (eight) hours as needed for nausea or vomiting. 10/09/21   Fredia Sorrow, MD  oxyCODONE (OXY IR/ROXICODONE) 5 MG immediate release tablet Take 1 tablet (5 mg total) by mouth every 6 (six) hours as needed for severe pain. 01/21/20   Meuth, Brooke A, PA-C  pantoprazole (PROTONIX) 20 MG tablet Take 1 tablet (20 mg total) by mouth daily. 01/08/20   Rayna Sexton, PA-C  polyethylene glycol (MIRALAX / GLYCOLAX) 17 g packet Take 17 g by mouth daily as needed for mild constipation. 01/21/20   Meuth, Brooke A, PA-C  predniSONE (STERAPRED UNI-PAK 21 TAB) 10 MG (21) TBPK tablet Take by mouth daily. Take 6 tabs by mouth daily  for 2 days, then 5 tabs for 2 days, then 4 tabs for 2 days, then 3 tabs for 2 days, 2 tabs for 2 days, then 1 tab by mouth daily for 2 days 09/19/21   Roemhildt, Lorin T, PA-C  triamcinolone cream (KENALOG) 0.1 %  Apply 1 application topically 2 (two) times daily. Limit use on body to two weeks, limit use on buttock, face to one week 09/01/21   Prosperi, Christian H, PA-C      Allergies    Amoxicillin    Review of Systems   Review of Systems  Constitutional:  Negative for chills and fever.  HENT:  Positive for ear pain. Negative for congestion, rhinorrhea and sore throat.   Respiratory:  Negative for cough and shortness of breath.   Cardiovascular:  Negative for chest pain.  Gastrointestinal:  Negative for abdominal pain, diarrhea, nausea and vomiting.  Genitourinary:  Negative for dysuria and hematuria.  Musculoskeletal:  Negative for joint swelling  and myalgias.  Skin:  Positive for rash.   Physical Exam Updated Vital Signs BP 109/77 (BP Location: Right Arm)    Pulse 85    Temp 99.1 F (37.3 C) (Oral)    Resp 18    Ht 5\' 6"  (1.676 m)    Wt 85.7 kg    LMP 10/09/2021    SpO2 98%    BMI 30.51 kg/m  Physical Exam Vitals and nursing note reviewed.  Constitutional:      General: She is not in acute distress.    Appearance: Normal appearance. She is not toxic-appearing.  HENT:     Head: Normocephalic and atraumatic.     Right Ear: Tympanic membrane, ear canal and external ear normal. No mastoid tenderness.     Left Ear: Tenderness present. No laceration or drainage. No mastoid tenderness. Tympanic membrane is bulging.     Ears:     Comments: Mildly erythematous and edematous canal. Bulging, white TM. No mastoid tenderness.     Mouth/Throat:     Mouth: Mucous membranes are moist.     Pharynx: Uvula midline. No pharyngeal swelling or posterior oropharyngeal erythema.  Eyes:     General: No scleral icterus. Cardiovascular:     Rate and Rhythm: Normal rate and regular rhythm.  Pulmonary:     Effort: Pulmonary effort is normal.     Breath sounds: Normal breath sounds.  Abdominal:     General: Abdomen is flat. Bowel sounds are normal.     Palpations: Abdomen is soft.  Musculoskeletal:        General: No deformity.     Cervical back: Normal range of motion.  Skin:    General: Skin is warm and dry.     Comments: Various sores at different healing stages noted to the chest, back, and buttock. No open sores, all are scabbed, if not with solid pink granulosus tissue. No discharge noted. No burrowing noted in the finger spaces or toes. Not consistent with scabies or bed bugs. No skin sloughing or blistering noted. No active vesicular rash.   Neurological:     General: No focal deficit present.     Mental Status: She is alert. Mental status is at baseline.       ED Results / Procedures / Treatments   Labs (all labs ordered are  listed, but only abnormal results are displayed) Labs Reviewed - No data to display  EKG None  Radiology No results found.  Procedures Procedures    Medications Ordered in ED Medications - No data to display  ED Course/ Medical Decision Making/ A&P                           Medical Decision Making Amount and/or Complexity of Data Reviewed  Labs: ordered.  Risk Prescription drug management.   41 year old female presents emerged department for evaluation of left ear pain and rash.  She reports left ear pain going on for 3 days, denies any discharge from the area or any URI symptoms.  She reports the rash has been ongoing for a few months.  Differential diagnosis includes but is not limited to scabies, bedbugs, herpes, shingles, SJS, TENS, chronic dermatitis, mastoiditis, otitis media, otitis externa, HIV, syphilis. Vital signs are unremarkable. Physical exam shows scabbed lesions at various stages of healing, most have complete pink granulosus tissue overlying.No skin sloughing, open vesicles, or blistering noted. Left TM shows white bulging TM with some erythema and edema of the external canal. No mastoid tenderness.   Will attempt to obtain viral swab of some of the lesions on her buttocks. Ordered G/C, syphilis, and HIV testing.   The patient declined testing and does not want any blood testing. Discussed my concerns with her given her rash, but she is adamant and refuses.   Low suspicion for SJS or TENS given the fact there is no skin sloughing or blistering noted.  Exam was not consistent with shingles.  She reports the rash is more itching than painful.  Additionally exam is not consistent with scabies or bedbugs.  We will observe for mastoiditis as she does not have any swelling or tenderness in the area.  We will treat patient for otitis media and otitis externa with topical and oral medications.  I advised her due to the longstanding chronicity of this rash to follow-up with  the PCP for dermatology referral or to go to a dermatology in her network.  Additionally, since she is complained of left ear pain often, recommended that she go to an ENT for further evaluation.  1 included in the discharge paperwork.  Return precautions discussed.  Patient agrees to plan.  Patient is stable and being discharged home in good condition.  I discussed this case with my attending physician who cosigned this note including patient's presenting symptoms, physical exam, and planned diagnostics and interventions. Attending physician stated agreement with plan or made changes to plan which were implemented.    Final Clinical Impression(s) / ED Diagnoses Final diagnoses:  Rash  Acute diffuse otitis externa of left ear  Acute otitis media, unspecified otitis media type    Rx / DC Orders ED Discharge Orders          Ordered    cefdinir (OMNICEF) 300 MG capsule  2 times daily        10/23/21 1443    neomycin-polymyxin-hydrocortisone (CORTISPORIN) OTIC solution  4 times daily        10/23/21 1443    hydrocortisone cream 1 %        10/23/21 1443              Sherrell Puller, PA-C 10/25/21 2047    Deno Etienne, DO 10/26/21 [id number]

## 2021-10-23 NOTE — ED Notes (Signed)
Pt has refused blood work after one attempt of trying to obtain blood. PA and nurse is aware.

## 2021-10-23 NOTE — ED Triage Notes (Signed)
Pt arrives pov, ambulatory to triage, c/o left ear pain with decreased hearing x 3 days, itchy rash on back and buttocks x 1 month. Denies fever

## 2021-10-23 NOTE — ED Notes (Signed)
Refused DC VS

## 2021-10-24 LAB — GC/CHLAMYDIA PROBE AMP (~~LOC~~) NOT AT ARMC
Chlamydia: NEGATIVE
Comment: NEGATIVE
Comment: NORMAL
Neisseria Gonorrhea: NEGATIVE

## 2021-10-25 LAB — HSV CULTURE AND TYPING

## 2022-02-09 IMAGING — DX DG CHEST 2V
2 series · 2 of 2 positions shown · non-contrast
Comparison: Radiographs 01/08/2020 and 06/20/2017.

CLINICAL DATA: Midsternal chest pain since yesterday. Worsening
pain with eating and drinking.

EXAM:
CHEST - 2 VIEW

[chest pa]
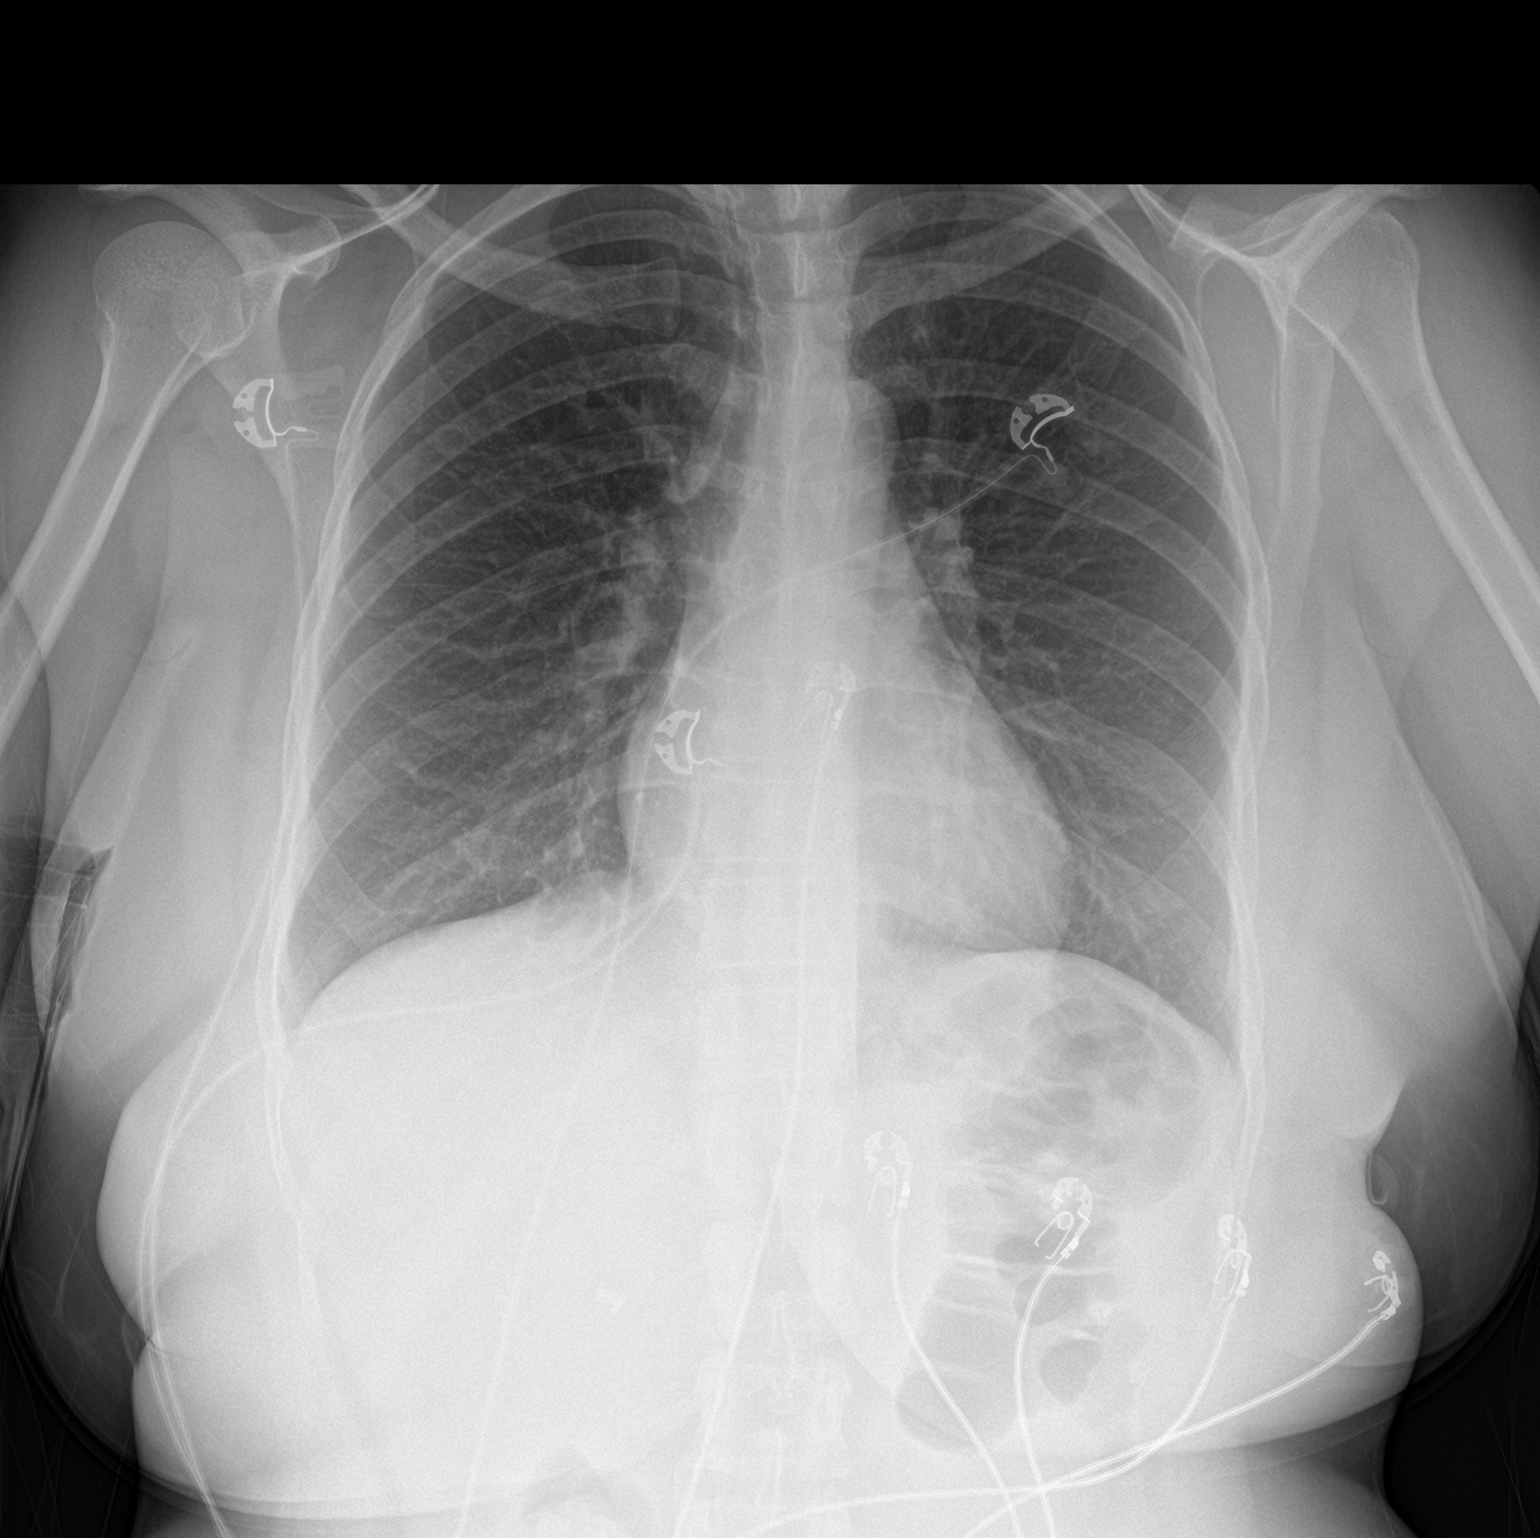

[chest lat]
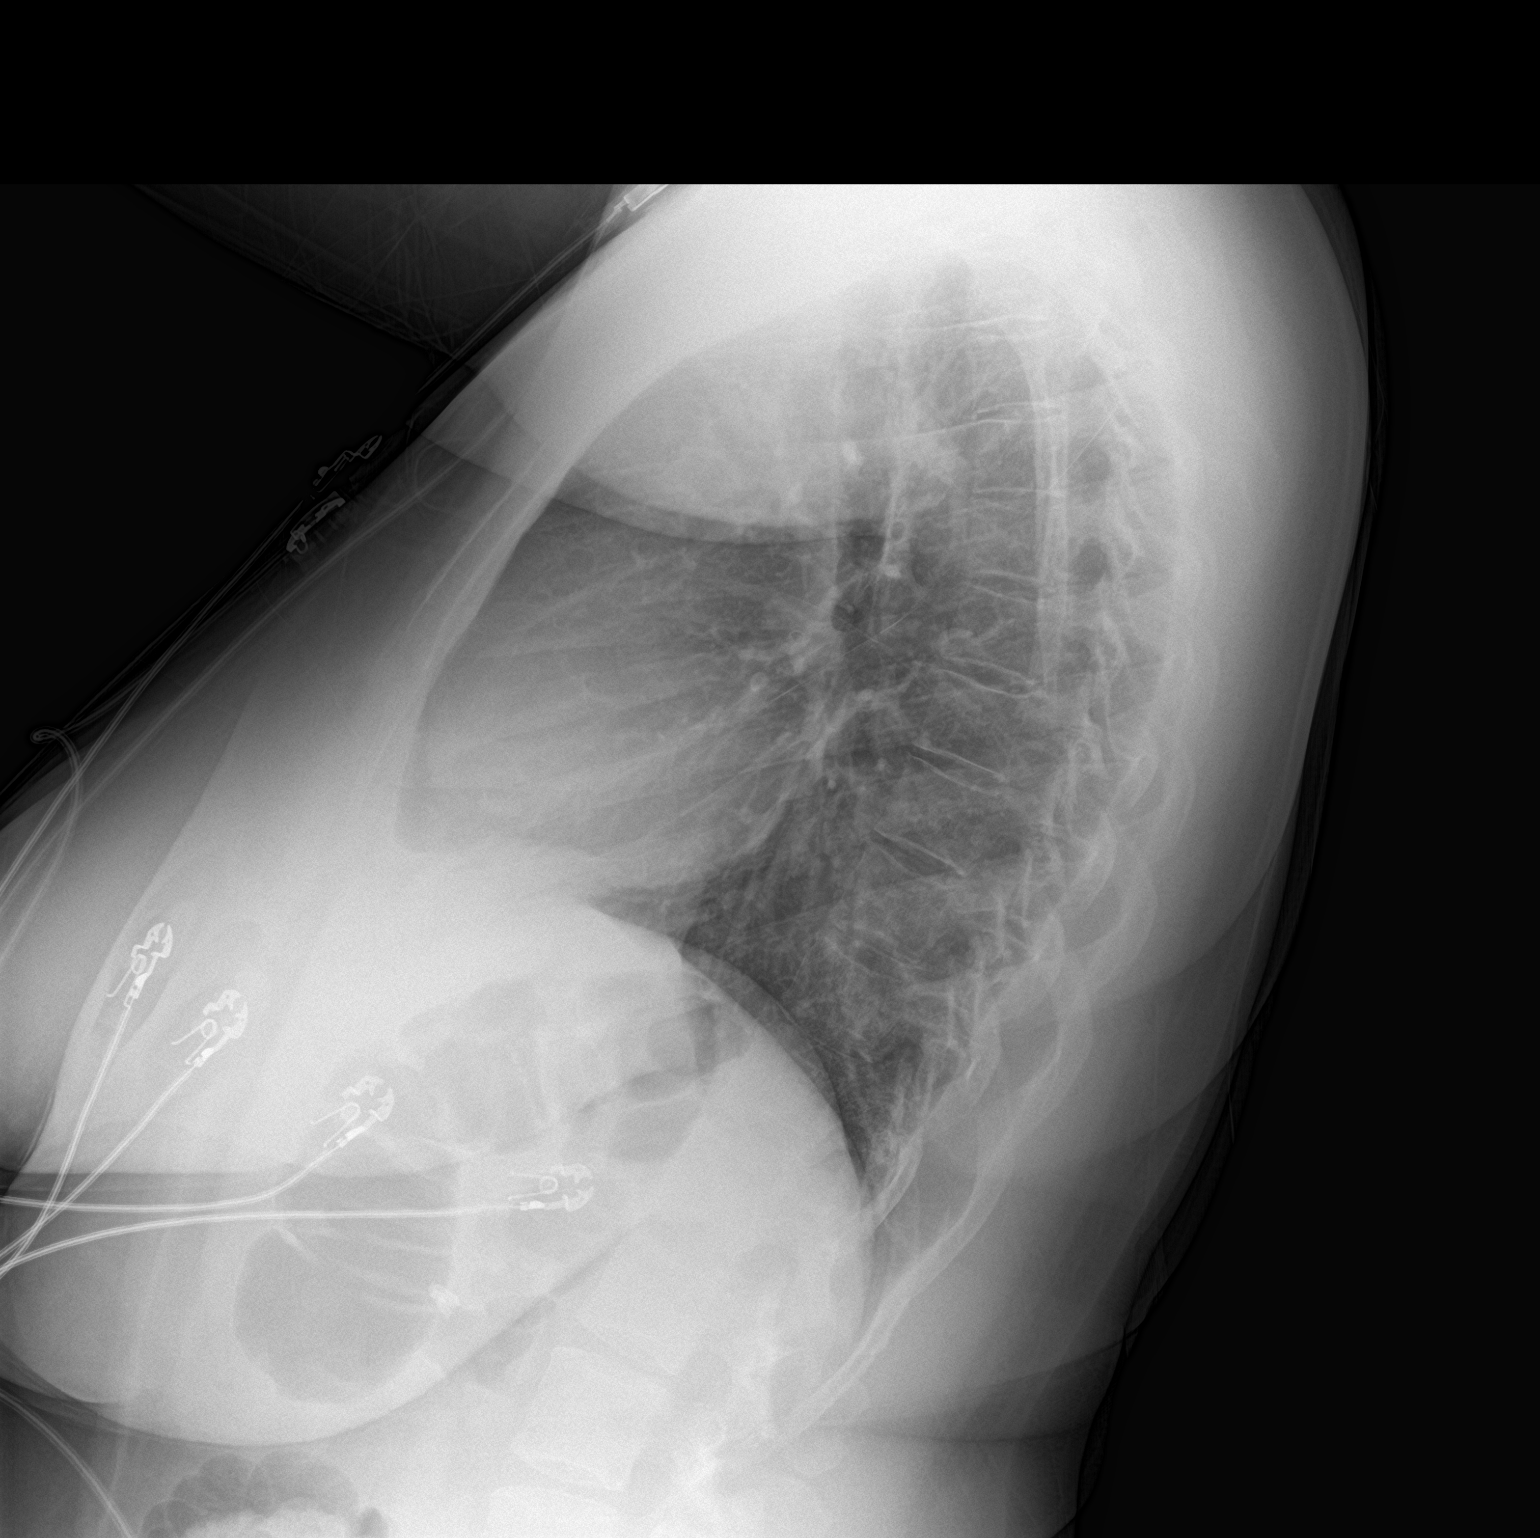

[2 of 2 positions shown; findings below may reference images not displayed]

FINDINGS: The heart size and mediastinal contours are normal. The lungs are
clear. There is no pleural effusion or pneumothorax. No acute
osseous findings are identified.
IMPRESSION: No active cardiopulmonary process.

## 2022-11-07 DIAGNOSIS — E119 Type 2 diabetes mellitus without complications: Secondary | ICD-10-CM | POA: Insufficient documentation

## 2023-12-01 ENCOUNTER — Encounter (HOSPITAL_BASED_OUTPATIENT_CLINIC_OR_DEPARTMENT_OTHER): Payer: Self-pay | Admitting: Emergency Medicine

## 2023-12-01 ENCOUNTER — Other Ambulatory Visit: Payer: Self-pay

## 2023-12-01 ENCOUNTER — Emergency Department (HOSPITAL_BASED_OUTPATIENT_CLINIC_OR_DEPARTMENT_OTHER)
Admission: EM | Admit: 2023-12-01 | Discharge: 2023-12-01 | Disposition: A | Discharge status: Home / Self Care | Attending: Emergency Medicine | Admitting: Emergency Medicine

## 2023-12-01 DIAGNOSIS — E119 Type 2 diabetes mellitus without complications: Secondary | ICD-10-CM | POA: Insufficient documentation

## 2023-12-01 DIAGNOSIS — B3731 Acute candidiasis of vulva and vagina: Secondary | ICD-10-CM | POA: Insufficient documentation

## 2023-12-01 DIAGNOSIS — R300 Dysuria: Secondary | ICD-10-CM | POA: Diagnosis present

## 2023-12-01 LAB — WET PREP, GENITAL
Clue Cells Wet Prep HPF POC: NONE SEEN
Sperm: NONE SEEN
Trich, Wet Prep: NONE SEEN
WBC, Wet Prep HPF POC: <10 (ref <10)
Yeast Wet Prep HPF POC: NONE SEEN

## 2023-12-01 LAB — URINALYSIS, MICROSCOPIC (REFLEX)

## 2023-12-01 LAB — PREGNANCY, URINE: Preg Test, Ur: NEGATIVE

## 2023-12-01 LAB — URINALYSIS, ROUTINE W REFLEX MICROSCOPIC
Bilirubin Urine: NEGATIVE
Glucose, UA: >=500 mg/dL — AB
Ketones, ur: NEGATIVE mg/dL
Leukocytes,Ua: NEGATIVE
Nitrite: NEGATIVE
Protein, ur: NEGATIVE mg/dL
Specific Gravity, Urine: 1.01 (ref 1.005–1.030)
pH: 5.5 (ref 5.0–8.0)

## 2023-12-01 MED ORDER — FLUCONAZOLE 150 MG PO TABS
150.0000 mg | ORAL_TABLET | Freq: Every day | ORAL | 0 refills | Status: AC
Start: 2023-12-04 — End: 2023-12-06

## 2023-12-01 MED ORDER — CLOBETASOL PROPIONATE 0.025 % EX CREA: 1.0000 "application " | TOPICAL_CREAM | Freq: Every day | CUTANEOUS | 0 refills | Status: AC

## 2023-12-01 MED ORDER — FLUCONAZOLE 150 MG PO TABS
150.0000 mg | ORAL_TABLET | Freq: Once | ORAL | Status: AC
  Administered 2023-12-01: 150 mg via ORAL
  Filled 2023-12-01: qty 1

## 2023-12-01 NOTE — Discharge Instructions (Addendum)
 Thank you for coming to [facility] Emergency Department. You were seen for vulvar and vaginal itching. We did an exam, labs, and these showed vulvovaginal candidiasis.  You were treated with a dose of Diflucan here in the emergency department.  We have prescribed 2 additional doses for a total of 3 doses to be taken on 12/04/2023 and 12/07/2023. We have also prescribed clobetasol steroid cream to use topically on the vulva once before bed. Do not use in combination with other steroid creams, as this one is more potent.   Please follow up with your gynecologist within 1-2 weeks. You may need additional treatment.   Do not hesitate to return to the ED or call 911 if you experience: -Worsening symptoms -Lightheadedness, passing out -Fevers/chills -Anything else that concerns you   Some tips for vulvar itching: Wear soft.  Breathable clothing, opt for loosefitting clothing and cotton underwear to allow for better airflow and reduce friction.  Avoid synthetic fabrics.  Wash of the vulva with warm water and a mild, unscented soap.  Avoid excessive over washing, as this can disrupt the natural balance of the vaginal flora and worsen itching.  Applying cool compresses can help soothe itching and inflammation.
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
## 2023-12-01 NOTE — ED Triage Notes (Signed)
 Pt c/o vaginal irritation and frequent urination; has been treated for yeast infection x 2 in the past 2 wks

## 2023-12-03 LAB — GC/CHLAMYDIA PROBE AMP (~~LOC~~) NOT AT ARMC
Chlamydia: NEGATIVE
Comment: NEGATIVE
Comment: NORMAL
Neisseria Gonorrhea: NEGATIVE
